# Patient Record
Sex: Female | Born: 1990
Health system: Southern US, Community
[De-identification: ages and names within clinical notes are randomized; demographics above are authoritative.]

---

## 2015-03-05 ENCOUNTER — Encounter (HOSPITAL_BASED_OUTPATIENT_CLINIC_OR_DEPARTMENT_OTHER): Payer: Self-pay | Admitting: *Deleted

## 2015-03-05 ENCOUNTER — Emergency Department (HOSPITAL_BASED_OUTPATIENT_CLINIC_OR_DEPARTMENT_OTHER): Payer: Self-pay

## 2015-03-05 ENCOUNTER — Emergency Department (HOSPITAL_BASED_OUTPATIENT_CLINIC_OR_DEPARTMENT_OTHER)
Admission: EM | Admit: 2015-03-05 | Discharge: 2015-03-05 | Disposition: A | Discharge status: Home / Self Care | Payer: Self-pay | Attending: Emergency Medicine | Admitting: Emergency Medicine

## 2015-03-05 DIAGNOSIS — M545 Low back pain, unspecified: Secondary | ICD-10-CM

## 2015-03-05 DIAGNOSIS — R52 Pain, unspecified: Secondary | ICD-10-CM

## 2015-03-05 DIAGNOSIS — Z3202 Encounter for pregnancy test, result negative: Secondary | ICD-10-CM | POA: Insufficient documentation

## 2015-03-05 LAB — PREGNANCY, URINE: PREG TEST UR: NEGATIVE

## 2015-03-05 LAB — URINALYSIS, ROUTINE W REFLEX MICROSCOPIC
Bilirubin Urine: NEGATIVE
Glucose, UA: NEGATIVE mg/dL
Hgb urine dipstick: NEGATIVE
Ketones, ur: NEGATIVE mg/dL
Leukocytes, UA: NEGATIVE
Nitrite: NEGATIVE
Protein, ur: NEGATIVE mg/dL
SPECIFIC GRAVITY, URINE: 1.023 (ref 1.005–1.030)
Urobilinogen, UA: 1 mg/dL (ref 0.0–1.0)
pH: 7.5 (ref 5.0–8.0)

## 2015-03-05 MED ORDER — CYCLOBENZAPRINE HCL 10 MG PO TABS: 10.0000 mg | ORAL_TABLET | Freq: Two times a day (BID) | ORAL | Status: DC | PRN

## 2015-03-05 MED ORDER — PREDNISONE 20 MG PO TABS: ORAL_TABLET | ORAL | Status: DC

## 2015-03-05 NOTE — ED Provider Notes (Signed)
CSN: [id number]   Arrival date & time 03/05/15 1714  History  This chart was scribed for  Andrea Barrette, MD by Andrea Glover, ED Scribe. This patient was seen in room MH04/MH04 and the patient's care was started at 7:15 PM.  Chief Complaint  Patient presents with  . Back Pain    HPI The history is provided by the patient. No language interpreter was used.   Andrea Glover is a 24 y.o. female who presents to the Emergency Department complaining of chronic lower back pain with onset 1 year ago. The pain is described as spasming and occasionally sharp. Extension of the legs and emotional upset exacerbate the pain. The pain has worsened in the last 3 weeks since starting water aerobics. She took nothing for pain PTA but notes that it is not improved with rest. Rates the pain 5/10 in severity. Pt denies pain radiation, LE weakness or numbness, bowel or bladder incontinence, and abdominal pain.  History reviewed. No pertinent past medical history.  History reviewed. No pertinent past surgical history.  No family history on file.  Social History  Substance Use Topics  . Smoking status: Never Smoker   . Smokeless tobacco: None  . Alcohol Use: No     Review of Systems  Gastrointestinal: Negative for abdominal pain.  Musculoskeletal: Positive for back pain.  Neurological: Negative for weakness and numbness.     Home Medications   Prior to Admission medications   Medication Sig Start Date End Date Taking? Authorizing Provider  cyclobenzaprine (FLEXERIL) 10 MG tablet Take 1 tablet (10 mg total) by mouth 2 (two) times daily as needed for muscle spasms. 03/05/15   Andrea Barrette, MD  predniSONE (DELTASONE) 20 MG tablet 3 tabs po daily x 3 days, then 2 tabs x 3 days, then 1.5 tabs x 3 days, then 1 tab x 3 days, then 0.5 tabs x 3 days 03/05/15   Andrea Barrette, MD    Allergies  Review of patient's allergies indicates no known allergies.  Triage Vitals: BP 102/51 mmHg  Pulse 92  Temp(Src)  98.3 F (36.8 C) (Oral)  Resp 18  Ht 5\' 7"  (1.702 m)  Wt 250 lb (113.399 kg)  BMI 39.15 kg/m2  SpO2 100%  LMP 02/19/2015  Physical Exam  Constitutional: She is oriented to person, place, and time. She appears well-developed and well-nourished.  HENT:  Head: Normocephalic and atraumatic.  Eyes: EOM are normal. Pupils are equal, round, and reactive to light.  Neck: Neck supple.  Cardiovascular: Normal rate, regular rhythm, normal heart sounds and intact distal pulses.   Pulmonary/Chest: Effort normal and breath sounds normal.  Abdominal: Soft. Bowel sounds are normal. She exhibits no distension. There is no tenderness.  Musculoskeletal: Normal range of motion. She exhibits no edema.  Endorses pain over the low lumbar sacral spine and the sacrum.  Neurological: She is alert and oriented to person, place, and time. She has normal strength. She exhibits normal muscle tone. Coordination normal. GCS eye subscore is 4. GCS verbal subscore is 5. GCS motor subscore is 6.  Patient is ambulatory without difficulty.  Skin: Skin is warm, dry and intact.  Psychiatric: She has a normal mood and affect.    ED Course  Procedures   DIAGNOSTIC STUDIES: Oxygen Saturation is 100% on RA, normal by my interpretation.    COORDINATION OF CARE: 7:19 PM Discussed treatment plan which includes lab work and lumbar spine XR with pt at bedside and pt agreed to plan.  8:09 PM  I re-evaluated the patient and provided an update on the results of her XR.  Labs Review-  Labs Reviewed  URINALYSIS, ROUTINE W REFLEX MICROSCOPIC (NOT AT [facility] - Abnormal; Notable for the following:    APPearance CLOUDY (*)    All other components within normal limits  PREGNANCY, URINE    Imaging Review Dg Lumbar Spine Complete  03/05/2015   CLINICAL DATA:  One year of lumbago, increased over past 2 weeks  EXAM: LUMBAR SPINE - COMPLETE 4+ VIEW  COMPARISON:  None.  FINDINGS: Frontal, lateral, spot lumbosacral lateral, and  bilateral oblique views were obtained. There are 5 non-rib-bearing lumbar type vertebral bodies. There is no fracture or spondylolisthesis. Disc spaces appear intact. There is no appreciable facet arthropathy.  IMPRESSION: No fracture or spondylolisthesis.  No appreciable arthropathy.   Electronically Signed   By: Bretta Bang III M.D.   On: 03/05/2015 20:02    EKG Interpretation None     MDM   Final diagnoses:  Midline low back pain without sciatica   Patient presents with chronic lower back pain that has been exacerbated with new activities. No associated neurologic dysfunction, GU dysfunction or GI symptoms. Patient is pursuing new physical activity and weight loss. She is counseled on follow-up with a family physician and sports orthopedics for ongoing assistance with weight loss and exercise in association with low back pain. She'll be treated with prednisone and Flexeril for pain control.   Andrea Barrette, MD 03/05/15 2013

## 2015-03-05 NOTE — Discharge Instructions (Signed)
Back Pain, Adult Low back pain is very common. About 1 in 5 people have back pain.The cause of low back pain is rarely dangerous. The pain often gets better over time.About half of people with a sudden onset of back pain feel better in just 2 weeks. About 8 in 10 people feel better by 6 weeks.  CAUSES Some common causes of back pain include:  Strain of the muscles or ligaments supporting the spine.  Wear and tear (degeneration) of the spinal discs.  Arthritis.  Direct injury to the back. DIAGNOSIS Most of the time, the direct cause of low back pain is not known.However, back pain can be treated effectively even when the exact cause of the pain is unknown.Answering your caregiver's questions about your overall health and symptoms is one of the most accurate ways to make sure the cause of your pain is not dangerous. If your caregiver needs more information, he or she may order lab work or imaging tests (X-rays or MRIs).However, even if imaging tests show changes in your back, this usually does not require surgery. HOME CARE INSTRUCTIONS For many people, back pain returns.Since low back pain is rarely dangerous, it is often a condition that people can learn to manageon their own.   Remain active. It is stressful on the back to sit or stand in one place. Do not sit, drive, or stand in one place for more than 30 minutes at a time. Take short walks on level surfaces as soon as pain allows.Try to increase the length of time you walk each day.  Do not stay in bed.Resting more than 1 or 2 days can delay your recovery.  Do not avoid exercise or work.Your body is made to move.It is not dangerous to be active, even though your back may hurt.Your back will likely heal faster if you return to being active before your pain is gone.  Pay attention to your body when you bend and lift. Many people have less discomfortwhen lifting if they bend their knees, keep the load close to their bodies,and  avoid twisting. Often, the most comfortable positions are those that put less stress on your recovering back.  Find a comfortable position to sleep. Use a firm mattress and lie on your side with your knees slightly bent. If you lie on your back, put a pillow under your knees.  Only take over-the-counter or prescription medicines as directed by your caregiver. Over-the-counter medicines to reduce pain and inflammation are often the most helpful.Your caregiver may prescribe muscle relaxant drugs.These medicines help dull your pain so you can more quickly return to your normal activities and healthy exercise.  Put ice on the injured area.  Put ice in a plastic bag.  Place a towel between your skin and the bag.  Leave the ice on for 15-20 minutes, 03-04 times a day for the first 2 to 3 days. After that, ice and heat may be alternated to reduce pain and spasms.  Ask your caregiver about trying back exercises and gentle massage. This may be of some benefit.  Avoid feeling anxious or stressed.Stress increases muscle tension and can worsen back pain.It is important to recognize when you are anxious or stressed and learn ways to manage it.Exercise is a great option. SEEK MEDICAL CARE IF:  You have pain that is not relieved with rest or medicine.  You have pain that does not improve in 1 week.  You have new symptoms.  You are generally not feeling well. SEEK   IMMEDIATE MEDICAL CARE IF:   You have pain that radiates from your back into your legs.  You develop new bowel or bladder control problems.  You have unusual weakness or numbness in your arms or legs.  You develop nausea or vomiting.  You develop abdominal pain.  You feel faint. Document Released: 07/11/2005 Document Revised: 01/10/2012 Document Reviewed: 11/12/2013 ExitCare Patient Information 2015 [employer]. This information is not intended to replace advice given to you by your health care provider. Make sure you  discuss any questions you have with your health care provider.  

## 2015-03-05 NOTE — ED Notes (Signed)
Lower back pain for a year. States it has gotten to the point she is having back spasms.

## 2015-10-20 ENCOUNTER — Encounter (HOSPITAL_BASED_OUTPATIENT_CLINIC_OR_DEPARTMENT_OTHER): Payer: Self-pay

## 2015-10-20 ENCOUNTER — Emergency Department (HOSPITAL_BASED_OUTPATIENT_CLINIC_OR_DEPARTMENT_OTHER)
Admission: EM | Admit: 2015-10-20 | Discharge: 2015-10-20 | Disposition: A | Discharge status: Home / Self Care | Payer: Self-pay | Attending: Emergency Medicine | Admitting: Emergency Medicine

## 2015-10-20 DIAGNOSIS — R112 Nausea with vomiting, unspecified: Secondary | ICD-10-CM | POA: Insufficient documentation

## 2015-10-20 DIAGNOSIS — R509 Fever, unspecified: Secondary | ICD-10-CM | POA: Insufficient documentation

## 2015-10-20 DIAGNOSIS — Z3202 Encounter for pregnancy test, result negative: Secondary | ICD-10-CM | POA: Insufficient documentation

## 2015-10-20 DIAGNOSIS — R51 Headache: Secondary | ICD-10-CM | POA: Insufficient documentation

## 2015-10-20 DIAGNOSIS — R220 Localized swelling, mass and lump, head: Secondary | ICD-10-CM | POA: Insufficient documentation

## 2015-10-20 LAB — URINALYSIS, ROUTINE W REFLEX MICROSCOPIC
Bilirubin Urine: NEGATIVE
Glucose, UA: NEGATIVE mg/dL
HGB URINE DIPSTICK: NEGATIVE
Ketones, ur: NEGATIVE mg/dL
LEUKOCYTES UA: NEGATIVE
Nitrite: NEGATIVE
Protein, ur: NEGATIVE mg/dL
SPECIFIC GRAVITY, URINE: 1.012 (ref 1.005–1.030)
pH: 6.5 (ref 5.0–8.0)

## 2015-10-20 LAB — PREGNANCY, URINE: Preg Test, Ur: NEGATIVE

## 2015-10-20 MED ORDER — ONDANSETRON 8 MG PO TBDP: 8.0000 mg | ORAL_TABLET | Freq: Three times a day (TID) | ORAL | Status: DC | PRN

## 2015-10-20 MED ORDER — ONDANSETRON HCL 4 MG/2ML IJ SOLN
4.0000 mg | Freq: Once | INTRAMUSCULAR | Status: AC
  Administered 2015-10-20: 4 mg via INTRAVENOUS
  Filled 2015-10-20: qty 2

## 2015-10-20 MED ORDER — IBUPROFEN 400 MG PO TABS
400.0000 mg | ORAL_TABLET | Freq: Once | ORAL | Status: AC
  Administered 2015-10-20: 400 mg via ORAL
  Filled 2015-10-20: qty 1

## 2015-10-20 MED ORDER — SODIUM CHLORIDE 0.9 % IV BOLUS (SEPSIS)
1000.0000 mL | Freq: Once | INTRAVENOUS | Status: AC
  Administered 2015-10-20: 1000 mL via INTRAVENOUS

## 2015-10-20 NOTE — ED Notes (Signed)
Pt c/o an episode of n/v morning at 0400 as well as facial swelling.  No medicine taken at home to relieve symptoms.  Pt c/o bilateral eye pain and generalized abdominal pain.

## 2015-10-20 NOTE — Discharge Instructions (Signed)
Nausea, Adult °Nausea is the feeling that you have an upset stomach or have to vomit. Nausea by itself is not likely a serious concern, but it may be an early sign of more serious medical problems. As nausea gets worse, it can lead to vomiting. If vomiting develops, there is the risk of dehydration.  °CAUSES  °· Viral infections. °· Food poisoning. °· Medicines. °· Pregnancy. °· Motion sickness. °· Migraine headaches. °· Emotional distress. °· Severe pain from any source. °· Alcohol intoxication. °HOME CARE INSTRUCTIONS °· Get plenty of rest. °· Ask your caregiver about specific rehydration instructions. °· Eat small amounts of food and sip liquids more often. °· Take all medicines as told by your caregiver. °SEEK MEDICAL CARE IF: °· You have not improved after 2 days, or you get worse. °· You have a headache. °SEEK IMMEDIATE MEDICAL CARE IF:  °· You have a fever. °· You faint. °· You keep vomiting or have blood in your vomit. °· You are extremely weak or dehydrated. °· You have dark or bloody stools. °· You have severe chest or abdominal pain. °MAKE SURE YOU: °· Understand these instructions. °· Will watch your condition. °· Will get help right away if you are not doing well or get worse. °  °This information is not intended to replace advice given to you by your health care provider. Make sure you discuss any questions you have with your health care provider. °  °Document Released: 08/18/2004 Document Revised: 08/01/2014 Document Reviewed: 03/23/2011 °Elsevier Interactive Patient Education ©2016 Elsevier Inc. ° °

## 2015-10-20 NOTE — ED Provider Notes (Signed)
CSN: [id number]     Arrival date & time 10/20/15  0600 History   First MD Initiated Contact with Patient 10/20/15 [phone]     Chief Complaint  Patient presents with  . Facial Swelling     (Consider location/radiation/quality/duration/timing/severity/associated sxs/prior Treatment) HPI  This is a 25 year old female who awakened this morning it 4 AM with facial swelling, nausea and vomiting. She estimates she vomited about 8 times. The swelling in her face has subsequently improved significantly. The swelling was not painful but she has a headache. She had a subjective fever but is afebrile on arrival. She had some generalized abdominal pain earlier that is no longer present. She denies diarrhea.   History reviewed. No pertinent past medical history. History reviewed. No pertinent past surgical history. No family history on file. Social History  Substance Use Topics  . Smoking status: Never Smoker   . Smokeless tobacco: None  . Alcohol Use: No   OB History    No data available     Review of Systems  All other systems reviewed and are negative.   Allergies  Review of patient's allergies indicates no known allergies.  Home Medications   Prior to Admission medications   Medication Sig Start Date End Date Taking? Authorizing Provider  cyclobenzaprine (FLEXERIL) 10 MG tablet Take 1 tablet (10 mg total) by mouth 2 (two) times daily as needed for muscle spasms. 03/05/15   Arby BarretteMarcy Pfeiffer, MD  predniSONE (DELTASONE) 20 MG tablet 3 tabs po daily x 3 days, then 2 tabs x 3 days, then 1.5 tabs x 3 days, then 1 tab x 3 days, then 0.5 tabs x 3 days 03/05/15   Arby BarretteMarcy Pfeiffer, MD   BP 150/81 mmHg  Pulse 69  Temp(Src) 98.3 F (36.8 C) (Oral)  Resp 18  Ht 5\' 7"  (1.702 m)  Wt 270 lb (122.471 kg)  BMI 42.28 kg/m2  SpO2 100%  LMP 10/02/2015   Physical Exam General: Well-developed, well-nourished female in no acute distress; appearance consistent with age of record HENT: normocephalic;  atraumatic; salivary glands nontender and without significant enlargement Eyes: pupils equal, round and reactive to light; extraocular muscles intact Neck: supple Heart: regular rate and rhythm Lungs: clear to auscultation bilaterally Abdomen: soft; nondistended; nontender; no masses or hepatosplenomegaly; bowel sounds present Extremities: No deformity; full range of motion; pulses normal Neurologic: Awake, alert and oriented; motor function intact in all extremities and symmetric; no facial droop Skin: Warm and dry Psychiatric: Normal mood and affect    ED Course  Procedures (including critical care time)   MDM  Nursing notes and vitals signs, including pulse oximetry, reviewed.  Summary of this visit's results, reviewed by myself:  Labs:  Results for orders placed or performed during the hospital encounter of 10/20/15 (from the past 24 hour(s))  Pregnancy, urine     Status: None   Collection Time: 10/20/15  6:45 AM  Result Value Ref Range   Preg Test, Ur NEGATIVE NEGATIVE  Urinalysis, Routine w reflex microscopic ([facility])     Status: Abnormal   Collection Time: 10/20/15  6:45 AM  Result Value Ref Range   Color, Urine YELLOW YELLOW   APPearance CLOUDY (A) CLEAR   Specific Gravity, Urine 1.012 1.005 - 1.030   pH 6.5 5.0 - 8.0   Glucose, UA NEGATIVE NEGATIVE mg/dL   Hgb urine dipstick NEGATIVE NEGATIVE   Bilirubin Urine NEGATIVE NEGATIVE   Ketones, ur NEGATIVE NEGATIVE mg/dL   Protein, ur NEGATIVE NEGATIVE mg/dL  Nitrite NEGATIVE NEGATIVE   Leukocytes, UA NEGATIVE NEGATIVE   7:20 AM IVF bolus and Zofran ordered.   Paula Libra, MD 10/20/15 [phone]

## 2015-10-20 NOTE — ED Notes (Signed)
Woke w n/v this am  States jaws were swollen,  Eye pain  Denies diarrhea

## 2015-12-06 ENCOUNTER — Emergency Department (HOSPITAL_BASED_OUTPATIENT_CLINIC_OR_DEPARTMENT_OTHER)
Admission: EM | Admit: 2015-12-06 | Discharge: 2015-12-06 | Disposition: A | Discharge status: Home / Self Care | Payer: Self-pay | Attending: Emergency Medicine | Admitting: Emergency Medicine

## 2015-12-06 ENCOUNTER — Encounter (HOSPITAL_BASED_OUTPATIENT_CLINIC_OR_DEPARTMENT_OTHER): Payer: Self-pay | Admitting: Emergency Medicine

## 2015-12-06 DIAGNOSIS — L02212 Cutaneous abscess of back [any part, except buttock]: Secondary | ICD-10-CM | POA: Insufficient documentation

## 2015-12-06 DIAGNOSIS — L0291 Cutaneous abscess, unspecified: Secondary | ICD-10-CM

## 2015-12-06 MED ORDER — SULFAMETHOXAZOLE-TRIMETHOPRIM 800-160 MG PO TABS: 1.0000 | ORAL_TABLET | Freq: Two times a day (BID) | ORAL | Status: AC

## 2015-12-06 MED ORDER — LIDOCAINE HCL 2 % IJ SOLN
5.0000 mL | Freq: Once | INTRAMUSCULAR | Status: AC
  Administered 2015-12-06: 100 mg
  Filled 2015-12-06: qty 20

## 2015-12-06 NOTE — ED Notes (Signed)
Gauze dressing applied to wound on back.  Covered with tegaderm and taped at edges.

## 2015-12-06 NOTE — ED Notes (Signed)
Patient reprots that she has a "knot" under her left arm and to her lower back.

## 2015-12-06 NOTE — Discharge Instructions (Signed)
Please take all of your antibiotics until finished!  Follow up with your doctor or an urgent care in order to remove your packing in 48-72 hours.  You may return to the emergency department if you have  a fever that persists greater than 101 or your abscess appears to become infected (growing surrounding redness and warmth). Abscess An abscess (boil or furuncle) is an infected area that contains a collection of pus.  SYMPTOMS Signs and symptoms of an abscess include pain, tenderness, redness, or hardness. You may feel a moveable soft area under your skin. An abscess can occur anywhere in the body.  TREATMENT  A surgical cut (incision) may be made over your abscess to drain the pus. Gauze may be packed into the space or a drain may be looped through the abscess cavity (pocket). This provides a drain that will allow the cavity to heal from the inside outwards. The abscess may be painful for a few days, but should feel much better if it was drained.  Your abscess, if seen early, may not have localized and may not have been drained. If not, another appointment may be required if it does not get better on its own or with medications. HOME CARE INSTRUCTIONS   Only take over-the-counter or prescription medicines for pain, discomfort, or fever as directed by your caregiver.   Take your antibiotics as directed if they were prescribed. Finish them even if you start to feel better.   Keep the skin and clothes clean around your abscess.   If the abscess was drained, you will need to use gauze dressing to collect any draining pus. Dressings will typically need to be changed 3 or more times a day.   The infection may spread by skin contact with others. Avoid skin contact as much as possible.   Practice good hygiene. This includes regular hand washing, cover any draining skin lesions, and do not share personal care items.   If you participate in sports, do not share athletic equipment, towels, whirlpools,  or personal care items. Shower after every practice or tournament.   If a draining area cannot be adequately covered:   Do not participate in sports.   Children should not participate in day care until the wound has healed or drainage stops.   If your caregiver has given you a follow-up appointment, it is very important to keep that appointment. Not keeping the appointment could result in a much worse infection, chronic or permanent injury, pain, and disability. If there is any problem keeping the appointment, you must call back to this facility for assistance.  SEEK MEDICAL CARE IF:   You develop increased pain, swelling, redness, drainage, or bleeding in the wound site.   You develop signs of generalized infection including muscle aches, chills, fever, or a general ill feeling.   You have an oral temperature above 102 F (38.9 C).  MAKE SURE YOU:   Understand these instructions.   Will watch your condition.   Will get help right away if you are not doing well or get worse.  Document Released: 04/20/2005 Document Revised: 03/23/2011 Document Reviewed: 02/12/2008 [facility] Patient Information 2012 [place], [place].

## 2015-12-07 NOTE — ED Provider Notes (Signed)
CSN: [id number]     Arrival date & time 12/06/15  2145 History   First MD Initiated Contact with Patient 12/06/15 2212     Chief Complaint  Patient presents with  . Recurrent Skin Infections    (Consider location/radiation/quality/duration/timing/severity/associated sxs/prior Treatment) The history is provided by the patient and medical records. No language interpreter was used.   Andrea Glover is a 25 y.o. female  with no pertinent PMH who presents to the Emergency Department complaining of a painful (8/10) knot on her lower back. Patient states she first noticed a pea sized knot approximately 2 months ago. Over this timeframe, knot continued to grow. 2 days ago pain intensified and area became very tender to the touch. Tylenol taken with no relief of pain. Denies fever. No IV drug use. Has had similar areas occur in the past. She states one had to be drained, but others improved with medications.  History reviewed. No pertinent past medical history. History reviewed. No pertinent past surgical history. History reviewed. No pertinent family history. Social History  Substance Use Topics  . Smoking status: Never Smoker   . Smokeless tobacco: None  . Alcohol Use: No   OB History    No data available     Review of Systems  Constitutional: Negative for fever.  HENT: Negative for congestion.   Eyes: Negative for visual disturbance.  Respiratory: Negative for cough and shortness of breath.   Cardiovascular: Negative for chest pain.  Gastrointestinal: Negative for abdominal pain.  Musculoskeletal: Negative for arthralgias.  Skin: Positive for color change.  Allergic/Immunologic: Negative for immunocompromised state.  Neurological: Negative for headaches.      Allergies  Review of patient's allergies indicates no known allergies.  Home Medications   Prior to Admission medications   Medication Sig Start Date End Date Taking? Authorizing Provider  ondansetron (ZOFRAN ODT) 8 MG  disintegrating tablet Take 1 tablet (8 mg total) by mouth every 8 (eight) hours as needed for nausea or vomiting. 10/20/15   Paula Libra, MD  sulfamethoxazole-trimethoprim (BACTRIM DS,SEPTRA DS) 800-160 MG tablet Take 1 tablet by mouth 2 (two) times daily. 12/06/15 12/13/15  Fitzhugh Vizcarrondo Pilcher Mavi Un, PA-C   BP 119/61 mmHg  Pulse 82  Temp(Src) 98 F (36.7 C) (Oral)  Resp 16  Ht  (1.727 m)  Wt 117.028 kg  BMI 39.24 kg/m2  SpO2 100%  LMP 11/23/2015 Physical Exam  Constitutional: She is oriented to person, place, and time. She appears well-developed and well-nourished.  Alert and in no acute distress  HENT:  Head: Normocephalic and atraumatic.  Cardiovascular: Normal rate, regular rhythm and normal heart sounds.   Pulmonary/Chest: Effort normal and breath sounds normal. No respiratory distress. She has no wheezes. She has no rales.  Abdominal: Soft. She exhibits no distension. There is no tenderness.  Musculoskeletal: Normal range of motion.  No midline spinal tenderness.   Neurological: She is alert and oriented to person, place, and time.  Skin: Skin is warm and dry.     Nursing note and vitals reviewed.   ED Course  Procedures (including critical care time)  INCISION AND DRAINAGE Performed by: Chase Picket Esdras Delair Consent: Verbal consent obtained. Risks and benefits: risks, benefits and alternatives were discussed Type: abscess Body area: back Anesthesia: local infiltration Incision was made with a scalpel. Local anesthetic: lidocaine 2% Anesthetic total: 5 ml Complexity: complex Blunt dissection to break up loculations Drainage: purulent Drainage amount: moderate Packing material: 1/4 in iodoform gauze Patient tolerance: Patient tolerated the procedure  well with no immediate complications.   Labs Review Labs Reviewed - No data to display  Imaging Review No results found. I have personally reviewed and evaluated these images and lab results as part of my medical  decision-making.   EKG Interpretation None      MDM   Final diagnoses:  Abscess   Patient presenting with abscess requiring incision and drainage. Afebrile. No crepitance to suggest necrotizing fasciitis.  Incision and drainage performed per procedure note. Patient tolerated the procedure well. Patient was prescribed bactrim. Wound care instructions provided. Follow up for wound check in 2-3 days. Information for finding PCP given with discharge instructions. Return to ER if concern for spread of infection, increasing pain, fevers, or other concerns.    [facility] CenterJaime Pilcher Twilla Khouri, PA-C 12/07/15 0055  Melene Planan Floyd, DO 12/07/15 1921

## 2016-02-07 ENCOUNTER — Emergency Department (HOSPITAL_BASED_OUTPATIENT_CLINIC_OR_DEPARTMENT_OTHER): Payer: Self-pay

## 2016-02-07 ENCOUNTER — Encounter (HOSPITAL_BASED_OUTPATIENT_CLINIC_OR_DEPARTMENT_OTHER): Payer: Self-pay | Admitting: *Deleted

## 2016-02-07 ENCOUNTER — Emergency Department (HOSPITAL_BASED_OUTPATIENT_CLINIC_OR_DEPARTMENT_OTHER)
Admission: EM | Admit: 2016-02-07 | Discharge: 2016-02-08 | Disposition: A | Discharge status: Home / Self Care | Payer: Self-pay | Attending: Emergency Medicine | Admitting: Emergency Medicine

## 2016-02-07 DIAGNOSIS — R109 Unspecified abdominal pain: Secondary | ICD-10-CM

## 2016-02-07 DIAGNOSIS — R61 Generalized hyperhidrosis: Secondary | ICD-10-CM | POA: Insufficient documentation

## 2016-02-07 DIAGNOSIS — R509 Fever, unspecified: Secondary | ICD-10-CM | POA: Insufficient documentation

## 2016-02-07 DIAGNOSIS — K2970 Gastritis, unspecified, without bleeding: Secondary | ICD-10-CM | POA: Insufficient documentation

## 2016-02-07 LAB — COMPREHENSIVE METABOLIC PANEL
ALT: 12 U/L — ABNORMAL LOW (ref 14–54)
AST: 21 U/L (ref 15–41)
Albumin: 3.2 g/dL — ABNORMAL LOW (ref 3.5–5.0)
Alkaline Phosphatase: 66 U/L (ref 38–126)
Anion gap: 7 (ref 5–15)
BUN: 8 mg/dL (ref 6–20)
CO2: 24 mmol/L (ref 22–32)
Calcium: 8.5 mg/dL — ABNORMAL LOW (ref 8.9–10.3)
Chloride: 103 mmol/L (ref 101–111)
Creatinine, Ser: 0.73 mg/dL (ref 0.44–1.00)
GFR calc Af Amer: >60 mL/min (ref >60)
GFR calc non Af Amer: >60 mL/min (ref >60)
Glucose, Bld: 94 mg/dL (ref 65–99)
Potassium: 3.7 mmol/L (ref 3.5–5.1)
Sodium: 134 mmol/L — ABNORMAL LOW (ref 135–145)
Total Bilirubin: 0.4 mg/dL (ref 0.3–1.2)
Total Protein: 7.5 g/dL (ref 6.5–8.1)

## 2016-02-07 LAB — URINALYSIS, ROUTINE W REFLEX MICROSCOPIC
Glucose, UA: NEGATIVE mg/dL
Hgb urine dipstick: NEGATIVE
Ketones, ur: 15 mg/dL — AB
Leukocytes, UA: NEGATIVE
Nitrite: NEGATIVE
Protein, ur: NEGATIVE mg/dL
Specific Gravity, Urine: 1.026 (ref 1.005–1.030)
pH: 6 (ref 5.0–8.0)

## 2016-02-07 LAB — CBC
HCT: 35.2 % — ABNORMAL LOW (ref 36.0–46.0)
Hemoglobin: 11.5 g/dL — ABNORMAL LOW (ref 12.0–15.0)
MCH: 24.3 pg — ABNORMAL LOW (ref 26.0–34.0)
MCHC: 32.7 g/dL (ref 30.0–36.0)
MCV: 74.3 fL — ABNORMAL LOW (ref 78.0–100.0)
Platelets: 198 10*3/uL (ref 150–400)
RBC: 4.74 MIL/uL (ref 3.87–5.11)
RDW: 17.4 % — ABNORMAL HIGH (ref 11.5–15.5)
WBC: 4.6 10*3/uL (ref 4.0–10.5)

## 2016-02-07 LAB — PREGNANCY, URINE: Preg Test, Ur: NEGATIVE

## 2016-02-07 LAB — LIPASE, BLOOD: Lipase: 17 U/L (ref 11–51)

## 2016-02-07 MED ORDER — HYDROMORPHONE HCL 1 MG/ML IJ SOLN
1.0000 mg | Freq: Once | INTRAMUSCULAR | Status: AC
  Administered 2016-02-07: 1 mg via INTRAVENOUS
  Filled 2016-02-07: qty 1

## 2016-02-07 MED ORDER — ONDANSETRON HCL 4 MG PO TABS: 4.0000 mg | ORAL_TABLET | Freq: Four times a day (QID) | ORAL | Status: DC

## 2016-02-07 MED ORDER — ONDANSETRON HCL 4 MG/2ML IJ SOLN
4.0000 mg | Freq: Once | INTRAMUSCULAR | Status: AC
  Administered 2016-02-07: 4 mg via INTRAVENOUS
  Filled 2016-02-07: qty 2

## 2016-02-07 MED ORDER — FAMOTIDINE IN NACL 20-0.9 MG/50ML-% IV SOLN
20.0000 mg | Freq: Once | INTRAVENOUS | Status: AC
  Administered 2016-02-07: 20 mg via INTRAVENOUS
  Filled 2016-02-07: qty 50

## 2016-02-07 MED ORDER — IOPAMIDOL (ISOVUE-300) INJECTION 61%
100.0000 mL | Freq: Once | INTRAVENOUS | Status: AC | PRN
  Administered 2016-02-07: 100 mL via INTRAVENOUS

## 2016-02-07 MED ORDER — SODIUM CHLORIDE 0.9 % IV BOLUS (SEPSIS)
1000.0000 mL | Freq: Once | INTRAVENOUS | Status: AC
  Administered 2016-02-07: 1000 mL via INTRAVENOUS

## 2016-02-07 NOTE — Discharge Instructions (Signed)
Abdominal Pain, Adult °Many things can cause abdominal pain. Usually, abdominal pain is not caused by a disease and will improve without treatment. It can often be observed and treated at home. Your health care provider will do a physical exam and possibly order blood tests and X-rays to help determine the seriousness of your pain. However, in many cases, more time must pass before a clear cause of the pain can be found. Before that point, your health care provider may not know if you need more testing or further treatment. °HOME CARE INSTRUCTIONS °Monitor your abdominal pain for any changes. The following actions may help to alleviate any discomfort you are experiencing: °· Only take over-the-counter or prescription medicines as directed by your health care provider. °· Do not take laxatives unless directed to do so by your health care provider. °· Try a clear liquid diet (broth, tea, or water) as directed by your health care provider. Slowly move to a bland diet as tolerated. °SEEK MEDICAL CARE IF: °· You have unexplained abdominal pain. °· You have abdominal pain associated with nausea or diarrhea. °· You have pain when you urinate or have a bowel movement. °· You experience abdominal pain that wakes you in the night. °· You have abdominal pain that is worsened or improved by eating food. °· You have abdominal pain that is worsened with eating fatty foods. °· You have a fever. °SEEK IMMEDIATE MEDICAL CARE IF: °· Your pain does not go away within 2 hours. °· You keep throwing up (vomiting). °· Your pain is felt only in portions of the abdomen, such as the right side or the left lower portion of the abdomen. °· You pass bloody or black tarry stools. °MAKE SURE YOU: °· Understand these instructions. °· Will watch your condition. °· Will get help right away if you are not doing well or get worse. °  °This information is not intended to replace advice given to you by your health care provider. Make sure you discuss  any questions you have with your health care provider. °  °Document Released: 04/20/2005 Document Revised: 04/01/2015 Document Reviewed: 03/20/2013 °Elsevier Interactive Patient Education ©2016 [employer] ° °Gastritis, Adult °Gastritis is soreness and puffiness (inflammation) of the lining of the stomach. If you do not get help, gastritis can cause bleeding and sores (ulcers) in the stomach. °HOME CARE  °· Only take medicine as told by your doctor. °· If you were given antibiotic medicines, take them as told. Finish the medicines even if you start to feel better. °· Drink enough fluids to keep your pee (urine) clear or pale yellow. °· Avoid foods and drinks that make your problems worse. Foods you may want to avoid include: °¨ Caffeine or alcohol. °¨ Chocolate. °¨ Mint. °¨ Garlic and onions. °¨ Spicy foods. °¨ Citrus fruits, including oranges, lemons, or limes. °¨ Food containing tomatoes, including sauce, chili, salsa, and pizza. °¨ Fried and fatty foods. °· Eat small meals throughout the day instead of large meals. °GET HELP RIGHT AWAY IF:  °· You have black or dark red poop (stools). °· You throw up (vomit) blood. It may look like coffee grounds. °· You cannot keep fluids down. °· Your belly (abdominal) pain gets worse. °· You have a fever. °· You do not feel better after 1 week. °· You have any other questions or concerns. °MAKE SURE YOU:  °· Understand these instructions. °· Will watch your condition. °· Will get help right away if you are not doing   well or get worse. °  °This information is not intended to replace advice given to you by your health care provider. Make sure you discuss any questions you have with your health care provider. °  °Document Released: 12/28/2007 Document Revised: 10/03/2011 Document Reviewed: 08/24/2011 °Elsevier Interactive Patient Education ©2016 [employer] ° °

## 2016-02-07 NOTE — ED Provider Notes (Signed)
CSN: [id number]     Arrival date & time 02/07/16  1928 History  By signing my name below, I, Andrea Glover, attest that this documentation has been prepared under the direction and in the presence of Raeford Razor, MD . Electronically Signed: Levon Glover, Scribe. 02/07/2016. 9:07 PM.  Chief Complaint  Patient presents with  . Emesis   The history is provided by the patient. No language interpreter was used.   HPI Comments:  Andrea Glover is a 25 y.o. female who presents to the Emergency Department complaining of intermittent vomiting onset last night. Pt has been unable to keep food or fluids down. She has taken Dramamine with no relief. Pt reports Dramamine "gave her heartburn". She notes nausea yesterday, periumbilical pain, fever, chills, and diaphoresis.  Pt has positive sick contact. No abdominal PSHx. No hx of  ulcer or reflux. She denies dysuria, hematuria, and diarrhea.  History reviewed. No pertinent past medical history. History reviewed. No pertinent past surgical history. History reviewed. No pertinent family history. Social History  Substance Use Topics  . Smoking status: Never Smoker   . Smokeless tobacco: None  . Alcohol Use: No   OB History    No data available     Review of Systems  Constitutional: Positive for fever, chills and diaphoresis.  Gastrointestinal: Positive for nausea, vomiting and abdominal pain. Negative for diarrhea.  Genitourinary: Negative for dysuria and hematuria.  All other systems reviewed and are negative.   Allergies  Review of patient's allergies indicates no known allergies.  Home Medications   Prior to Admission medications   Not on File   BP 115/74 mmHg  Pulse 91  Temp(Src) 99.6 F (37.6 C) (Oral)  Resp 20  Ht  (1.702 m)  Wt 230 lb (104.327 kg)  BMI 36.01 kg/m2  SpO2 99%  LMP 02/05/2016 Physical Exam  Constitutional: She is oriented to person, place, and time. She appears well-developed and well-nourished. No  distress.  HENT:  Head: Normocephalic and atraumatic.  Eyes: Conjunctivae are normal.  Cardiovascular: Normal rate.   Pulmonary/Chest: Effort normal.  Abdominal: She exhibits no distension. There is tenderness. There is no rebound and no guarding.  Periumbilical and epigastric tenderness  Neurological: She is alert and oriented to person, place, and time.  Skin: Skin is warm and dry.  Psychiatric: She has a normal mood and affect.  Nursing note and vitals reviewed.   ED Course  Procedures  DIAGNOSTIC STUDIES:  Oxygen Saturation is 98% on RA, normal by my interpretation.    COORDINATION OF CARE:  9:01 PM Will order CT scan. Discussed treatment plan with pt at bedside and pt agreed to plan.  Labs Review Labs Reviewed  URINALYSIS, ROUTINE W REFLEX MICROSCOPIC (NOT AT [facility]) - Abnormal; Notable for the following:    APPearance CLOUDY (*)    Bilirubin Urine SMALL (*)    Ketones, ur 15 (*)    All other components within normal limits  COMPREHENSIVE METABOLIC PANEL - Abnormal; Notable for the following:    Sodium 134 (*)    Calcium 8.5 (*)    Albumin 3.2 (*)    ALT 12 (*)    All other components within normal limits  CBC - Abnormal; Notable for the following:    Hemoglobin 11.5 (*)    HCT 35.2 (*)    MCV 74.3 (*)    MCH 24.3 (*)    RDW 17.4 (*)    All other components within normal limits  PREGNANCY, URINE  LIPASE, BLOOD    Imaging Review No results found. I have personally reviewed and evaluated these images and lab results as part of my medical decision-making.   EKG Interpretation None      MDM   Final diagnoses:  Abdominal pain, unspecified abdominal location  Gastritis    I personally preformed the services scribed in my presence. The recorded information has been reviewed is accurate. Raeford RazorStephen Aniruddh Ciavarella, MD.   Raeford RazorStephen Mordechai Matuszak, MD 02/25/16 [phone]

## 2016-02-07 NOTE — ED Notes (Signed)
N/V x 1 day.  Reports abdominal and back pain when vomiting.

## 2016-03-09 ENCOUNTER — Emergency Department (HOSPITAL_BASED_OUTPATIENT_CLINIC_OR_DEPARTMENT_OTHER)
Admission: EM | Admit: 2016-03-09 | Discharge: 2016-03-10 | Disposition: A | Discharge status: Home / Self Care | Payer: Self-pay | Attending: Emergency Medicine | Admitting: Emergency Medicine

## 2016-03-09 ENCOUNTER — Encounter (HOSPITAL_BASED_OUTPATIENT_CLINIC_OR_DEPARTMENT_OTHER): Payer: Self-pay

## 2016-03-09 DIAGNOSIS — J039 Acute tonsillitis, unspecified: Secondary | ICD-10-CM | POA: Insufficient documentation

## 2016-03-09 NOTE — ED Triage Notes (Signed)
C/o right earache and sore throat x  5days after being ina pool-NAD-steady gait

## 2016-03-10 LAB — RAPID STREP SCREEN (MED CTR MEBANE ONLY): STREPTOCOCCUS, GROUP A SCREEN (DIRECT): NEGATIVE

## 2016-03-10 MED ORDER — DEXAMETHASONE 6 MG PO TABS
12.0000 mg | ORAL_TABLET | Freq: Once | ORAL | Status: AC
Start: 2016-03-10 — End: 2016-03-10
  Administered 2016-03-10: 12 mg via ORAL
  Filled 2016-03-10: qty 2

## 2016-03-10 MED ORDER — OXYCODONE-ACETAMINOPHEN 5-325 MG PO TABS
1.0000 | ORAL_TABLET | Freq: Once | ORAL | Status: AC
  Administered 2016-03-10: 1 via ORAL
  Filled 2016-03-10: qty 1

## 2016-03-10 MED ORDER — OXYCODONE-ACETAMINOPHEN 5-325 MG PO TABS
1.0000 | ORAL_TABLET | ORAL | 0 refills | Status: DC | PRN
Start: 2016-03-10 — End: 2017-05-03

## 2016-03-10 NOTE — ED Provider Notes (Signed)
[facility]   CSN: [id number] Arrival date & time: 03/09/16  2210     History   Chief Complaint Chief Complaint  Patient presents with  . Otalgia    HPI Andrea Glover is a 25 y.o. female.  The history is provided by the patient.  She comes in complaining of pain in her throat and right ear for the last 4 days. Pain is severe and she rates it at 8/10. It is worse with swallowing. Nothing makes it better. She is not treated it with anything. She denies fever, chills, sweats. She denies rhinorrhea. There is no cough, vomiting, diarrhea. She denies arthralgias or myalgias. Sick contacts.  History reviewed. No pertinent past medical history.  There are no active problems to display for this patient.   History reviewed. No pertinent surgical history.  OB History    No data available       Home Medications    Prior to Admission medications   Not on File    Family History No family history on file.  Social History Social History  Substance Use Topics  . Smoking status: Never Smoker  . Smokeless tobacco: Never Used  . Alcohol use No     Allergies   Review of patient's allergies indicates no known allergies.   Review of Systems Review of Systems  All other systems reviewed and are negative.    Physical Exam Updated Vital Signs BP 112/86 (BP Location: Left Arm)   Pulse 87   Temp 98 F (36.7 C) (Oral)   Resp 18   Ht 5\' 7"  (1.702 m)   Wt 258 lb (117 kg)   LMP 03/03/2016   SpO2 100%   BMI 40.41 kg/m   Physical Exam  Nursing note and vitals reviewed.  25 year old female, resting comfortably and in no acute distress. Vital signs are normal. Oxygen saturation is 100%, which is normal. Head is normocephalic and atraumatic. PERRLA, EOMI. tympanic membranes are clear bilaterally. Oropharynx shows some moderate to severe tonsillar hypertrophy bilaterally with exudate present. Neck is nontender and supple without adenopathy or  JVD. Back is nontender and there is no CVA tenderness. Lungs are clear without rales, wheezes, or rhonchi. Chest is nontender. Heart has regular rate and rhythm without murmur. Abdomen is soft, flat, nontender without masses or hepatosplenomegaly and peristalsis is normoactive. Extremities have no cyanosis or edema, full range of motion is present. Skin is warm and dry without rash. Neurologic: Mental status is normal, cranial nerves are intact, there are no motor or sensory deficits.  ED Treatments / Results  Labs (all labs ordered are listed, but only abnormal results are displayed) Labs Reviewed  RAPID STREP SCREEN (NOT AT [facility])  CULTURE, GROUP A STREP [facility])    Procedures Procedures (including critical care time)  Medications Ordered in ED Medications  dexamethasone (DECADRON) tablet 12 mg (12 mg Oral Given 03/10/16 0024)  oxyCODONE-acetaminophen (PERCOCET/ROXICET) 5-325 MG per tablet 1 tablet (1 tablet Oral Given 03/10/16 0024)     Initial Impression / Assessment and Plan / ED Course  I have reviewed the triage vital signs and the nursing notes.  Pertinent labs that were available during my care of the patient were reviewed by me and considered in my medical decision making (see chart for details).  Clinical Course    Tonsillitis. Ear pain is clearly referred from her tonsils. Strep screen is obtained and she's given a dose of dexamethasone as well as oxycodone-acetaminophen.  Strep screen  is come back negative. Specimen is sent for culture. She is discharged with prescription for oxycodone-acetaminophen. Work release given for 2 days.  Final Clinical Impressions(s) / ED Diagnoses   Final diagnoses:  Tonsillitis with exudate    New Prescriptions New Prescriptions   OXYCODONE-ACETAMINOPHEN (PERCOCET) 5-325 MG TABLET    Take 1 tablet by mouth every 4 (four) hours as needed for moderate pain.     Dione Boozeavid Cannan Beeck, MD 03/10/16 [phone]

## 2016-03-12 LAB — CULTURE, GROUP A STREP (THRC)

## 2016-12-02 ENCOUNTER — Emergency Department (HOSPITAL_BASED_OUTPATIENT_CLINIC_OR_DEPARTMENT_OTHER): Payer: Self-pay

## 2016-12-02 ENCOUNTER — Emergency Department (HOSPITAL_BASED_OUTPATIENT_CLINIC_OR_DEPARTMENT_OTHER)
Admission: EM | Admit: 2016-12-02 | Discharge: 2016-12-02 | Disposition: A | Discharge status: Home / Self Care | Payer: Self-pay | Attending: Emergency Medicine | Admitting: Emergency Medicine

## 2016-12-02 ENCOUNTER — Encounter (HOSPITAL_BASED_OUTPATIENT_CLINIC_OR_DEPARTMENT_OTHER): Payer: Self-pay

## 2016-12-02 DIAGNOSIS — R0981 Nasal congestion: Secondary | ICD-10-CM

## 2016-12-02 DIAGNOSIS — R05 Cough: Secondary | ICD-10-CM | POA: Insufficient documentation

## 2016-12-02 DIAGNOSIS — R0982 Postnasal drip: Secondary | ICD-10-CM | POA: Insufficient documentation

## 2016-12-02 DIAGNOSIS — R059 Cough, unspecified: Secondary | ICD-10-CM

## 2016-12-02 DIAGNOSIS — R509 Fever, unspecified: Secondary | ICD-10-CM | POA: Insufficient documentation

## 2016-12-02 MED ORDER — BENZONATATE 100 MG PO CAPS: 100.0000 mg | ORAL_CAPSULE | Freq: Three times a day (TID) | ORAL | 0 refills | Status: DC | PRN

## 2016-12-02 MED ORDER — BENZONATATE 100 MG PO CAPS
200.0000 mg | ORAL_CAPSULE | Freq: Once | ORAL | Status: AC
  Administered 2016-12-02: 200 mg via ORAL
  Filled 2016-12-02: qty 2

## 2016-12-02 MED FILL — BENZONATATE 100 MG CAP: 100 | 30 days supply | Qty: 90 | Fill #0

## 2016-12-02 NOTE — ED Triage Notes (Signed)
URI symptoms x 1 month.

## 2016-12-02 NOTE — ED Provider Notes (Signed)
[facility] Provider Note   CSN: [id number] Arrival date & time: 12/02/16  1538   By signing my name below, I, Soijett Blue, attest that this documentation has been prepared under the direction and in the presence of Michela PitcherMina Valma Rotenberg, PA-C Electronically Signed: Soijett Blue, ED Scribe. 12/02/16. 4:41 PM.  History   Chief Complaint Chief Complaint  Patient presents with  . URI    HPI Andrea Glover is a 26 y.o. female who presents to the Emergency Department complaining of constant, productive cough x yellow sputum onset 1 month ago. She notes that her cough is worse at night only. Pt reports associated voice hoarseness, subjective fever at night only, post-nasal drip, and rhinorrhea. Pt has tried OTC tylenol sinus and cold, benadryl, mucinex, zyrtec, claritin with no relief of her symptoms. She denies sinus pressure, sinus pain, sore throat, abdominal pain, nausea, vomiting, CP, SOB, fatigue, and any other symptoms. She notes that she is UTD with her immunizations. Denies PMHx of asthma, but notes that she has had bronchitis twice in the past.    The history is provided by the patient. No language interpreter was used.    History reviewed. No pertinent past medical history.  There are no active problems to display for this patient.   History reviewed. No pertinent surgical history.  OB History    No data available       Home Medications    Prior to Admission medications   Medication Sig Start Date End Date Taking? Authorizing Provider  benzonatate (TESSALON) 100 MG capsule Take 1 capsule (100 mg total) by mouth 3 (three) times daily as needed for cough. 12/02/16   Lenus Trauger A, PA-C  oxyCODONE-acetaminophen (PERCOCET) 5-325 MG tablet Take 1 tablet by mouth every 4 (four) hours as needed for moderate pain. 03/10/16   Dione BoozeGlick, David, MD    Family History No family history on file.  Social History Social History  Substance Use Topics  . Smoking status: Never Smoker  .  Smokeless tobacco: Never Used  . Alcohol use No     Allergies   Patient has no known allergies.   Review of Systems Review of Systems  Constitutional: Positive for fever (subjective). Negative for fatigue.  HENT: Positive for postnasal drip and rhinorrhea. Negative for sinus pain, sinus pressure and sore throat.        +voice hoarseness  Respiratory: Positive for cough (productive, yellow sputum). Negative for shortness of breath.   Cardiovascular: Negative for chest pain.  Gastrointestinal: Negative for abdominal pain, nausea and vomiting.     Physical Exam Updated Vital Signs BP 115/79 (BP Location: Left Arm)   Pulse 100   Temp 98 F (36.7 C) (Oral)   Resp 18   Ht 5\' 9"  (1.753 m)   LMP 11/02/2016   SpO2 98%   Physical Exam  Constitutional: She is oriented to person, place, and time. She appears well-developed and well-nourished. No distress.  HENT:  Head: Normocephalic and atraumatic.  Right Ear: Tympanic membrane, external ear and ear canal normal. Tympanic membrane is not erythematous and not bulging.  Left Ear: Tympanic membrane, external ear and ear canal normal. Tympanic membrane is not erythematous and not bulging.  Nose: No septal deviation. Right sinus exhibits no maxillary sinus tenderness and no frontal sinus tenderness. Left sinus exhibits no maxillary sinus tenderness and no frontal sinus tenderness.  Mouth/Throat: Uvula is midline, oropharynx is clear and moist and mucous membranes are normal.  Oropharynx clear with post nasal drip. TM's  with serous fluid, no bulging or erythema. Nasal septum is midline with pale pink boggy mucosa.   Eyes: EOM are normal.  Neck: Neck supple.  Cardiovascular: Normal rate, regular rhythm, normal heart sounds and intact distal pulses.  Exam reveals no gallop and no friction rub.   No murmur heard. Pulmonary/Chest: Effort normal and breath sounds normal. No respiratory distress. She has no wheezes. She has no rales.  Abdominal:  She exhibits no distension.  Musculoskeletal: Normal range of motion.  Lymphadenopathy:    She has no cervical adenopathy.  Neurological: She is alert and oriented to person, place, and time.  Skin: Skin is warm and dry.  Psychiatric: She has a normal mood and affect. Her behavior is normal.  Nursing note and vitals reviewed.    ED Treatments / Results  DIAGNOSTIC STUDIES: Oxygen Saturation is 98% on RA, nl by my interpretation.    COORDINATION OF CARE: 4:37 PM Discussed treatment plan with pt at bedside which includes CXR, flonase, antihistamine, tessalon Rx, and pt agreed to plan.   Radiology Dg Chest 2 View  Result Date: 12/02/2016 CLINICAL DATA:  Productive cough EXAM: CHEST  2 VIEW COMPARISON:  May 11, 2016 FINDINGS: Lungs are clear. Heart size and pulmonary vascularity are normal. No pneumothorax. No adenopathy. No bone lesions. IMPRESSION: No edema or consolidation. Electronically Signed   By: Bretta Bang III M.D.   On: 12/02/2016 16:20    Procedures Procedures (including critical care time)  Medications Ordered in ED Medications  benzonatate (TESSALON) capsule 200 mg (200 mg Oral Given 12/02/16 1647)     Initial Impression / Assessment and Plan / ED Course  I have reviewed the triage vital signs and the nursing notes.  Pertinent imaging results that were available during my care of the patient were reviewed by me and considered in my medical decision making (see chart for details).     Patient with productive cough and nasal congestion for 1 month. Afebrile, vital signs are stable, SpO2 98% on RA. Up-to-date on immunizations. Lungs clear to auscultation bilaterally. Remainder physical examination consistent with allergies. Chest x-ray negative for consolidation or infiltrate. Low suspicion of pneumonia, PE, tuberculosis, bronchitis, pleural effusion. Will Rx Tessalon for cough, encourage patient to try different over-the-counter allergy medications just  Flonase for congestion and to reduce postnasal drip and Allegra. Recommend follow-up with primary care for reevaluation. Discussed strict ED return precautions. Pt verbalized understanding of and agreement with plan and is safe for discharge home at this time.   Final Clinical Impressions(s) / ED Diagnoses   Final diagnoses:  Cough  Nasal congestion    New Prescriptions Discharge Medication List as of 12/02/2016  4:41 PM    START taking these medications   Details  benzonatate (TESSALON) 100 MG capsule Take 1 capsule (100 mg total) by mouth 3 (three) times daily as needed for cough., Starting Fri 12/02/2016, Print       I personally performed the services described in this documentation, which was scribed in my presence. The recorded information has been reviewed and is accurate.    Jeanie Sewer, PA-C 12/03/16 2243    Nira Conn, MD 12/06/16 1500

## 2016-12-02 NOTE — Discharge Instructions (Signed)
Take Tessalon as needed for cough. Try Flonase over-the-counter for your nasal congestion. Try other allergy medications such as Allegra for your cough and congestion. [facility] for [facility]. Return to the ED if you develop any concerning symptoms.

## 2017-05-03 ENCOUNTER — Emergency Department (HOSPITAL_BASED_OUTPATIENT_CLINIC_OR_DEPARTMENT_OTHER)
Admission: EM | Admit: 2017-05-03 | Discharge: 2017-05-03 | Disposition: A | Discharge status: Home / Self Care | Payer: Self-pay | Attending: Emergency Medicine | Admitting: Emergency Medicine

## 2017-05-03 ENCOUNTER — Encounter (HOSPITAL_BASED_OUTPATIENT_CLINIC_OR_DEPARTMENT_OTHER): Payer: Self-pay

## 2017-05-03 DIAGNOSIS — J0100 Acute maxillary sinusitis, unspecified: Secondary | ICD-10-CM | POA: Insufficient documentation

## 2017-05-03 MED ORDER — OXYMETAZOLINE HCL 0.05 % NA SOLN: 2.0000 | Freq: Two times a day (BID) | NASAL | 0 refills | Status: AC

## 2017-05-03 MED ORDER — IBUPROFEN 600 MG PO TABS: 600.0000 mg | ORAL_TABLET | Freq: Four times a day (QID) | ORAL | 0 refills | Status: DC | PRN

## 2017-05-03 MED ORDER — FLUTICASONE PROPIONATE 50 MCG/ACT NA SUSP: 2.0000 | Freq: Every day | NASAL | 0 refills | Status: AC

## 2017-05-03 MED ORDER — LORATADINE 10 MG PO TABS: 10.0000 mg | ORAL_TABLET | Freq: Every day | ORAL | 0 refills | Status: AC

## 2017-05-03 MED FILL — LORATADINE 10 MG TABLET: 10 | 100 days supply | Qty: 100 | Fill #0

## 2017-05-03 MED FILL — NASAL DECONGESTANT 0.05% SP: 0.05 | 15 days supply | Qty: 15 | Fill #0

## 2017-05-03 MED FILL — IBUPROFEN 600 MG TABLET: 600 | 7 days supply | Qty: 30 | Fill #0

## 2017-05-03 MED FILL — FLUTICASONE PROP 50 MCG SPR: 50 | 30 days supply | Qty: 16 | Fill #0

## 2017-05-03 NOTE — ED Triage Notes (Signed)
C/o HA x 4 days-c/o redness around left eye x 2 days and states she can feel her heart beat in her left ear-NAD-steady gait-texting in triage

## 2017-05-03 NOTE — ED Provider Notes (Signed)
[facility] Provider Note   CSN: [id number] Arrival date & time: 05/03/17  1143     History   Chief Complaint Chief Complaint  Patient presents with  . Headache    HPI Andrea Glover is a 26 y.o. female.  HPI Patient with 4 days of gradual onset left-sided headache. States the headache is worse with bending forward. She has left ear fullness and states she can hear her heartbeat in her ear. Denies any fever or chills. No neck pain or stiffness. No focal weakness or numbness. Patient states she's had migraine headache in the past with some similar symptoms such as photophobia and nausea. She is taking no medications at home. History reviewed. No pertinent past medical history.  There are no active problems to display for this patient.   History reviewed. No pertinent surgical history.  OB History    No data available       Home Medications    Prior to Admission medications   Medication Sig Start Date End Date Taking? Authorizing Provider  fluticasone (FLONASE) 50 MCG/ACT nasal spray Place 2 sprays into both nostrils daily. 05/03/17   Loren Racer, MD  ibuprofen (ADVIL,MOTRIN) 600 MG tablet Take 1 tablet (600 mg total) by mouth every 6 (six) hours as needed for moderate pain. 05/03/17   Loren Racer, MD  loratadine (CLARITIN) 10 MG tablet Take 1 tablet (10 mg total) by mouth daily. 05/03/17   Loren Racer, MD  oxymetazoline (AFRIN NASAL SPRAY) 0.05 % nasal spray Place 2 sprays into both nostrils 2 (two) times daily. 05/03/17 05/06/17  Loren Racer, MD    Family History No family history on file.  Social History Social History  Substance Use Topics  . Smoking status: Never Smoker  . Smokeless tobacco: Never Used  . Alcohol use No     Allergies   Patient has no known allergies.   Review of Systems Review of Systems  Constitutional: Negative for chills and fever.  HENT: Positive for sinus pain and sinus pressure. Negative for  congestion, rhinorrhea and sore throat.   Eyes: Positive for photophobia. Negative for visual disturbance.  Respiratory: Negative for cough and shortness of breath.   Cardiovascular: Negative for chest pain.  Gastrointestinal: Positive for nausea and vomiting. Negative for abdominal pain.  Genitourinary: Negative for dysuria, flank pain and frequency.  Musculoskeletal: Negative for back pain, myalgias and neck pain.  Skin: Negative for rash and wound.  Neurological: Positive for headaches. Negative for dizziness, syncope, weakness and numbness.  All other systems reviewed and are negative.    Physical Exam Updated Vital Signs BP 105/84 (BP Location: Left Arm)   Pulse 76   Temp 98.3 F (36.8 C) (Oral)   Resp 18   Ht  (1.727 m)   Wt (!) 145.2 kg (320 lb)   LMP 04/18/2017   SpO2 100%   BMI 48.66 kg/m   Physical Exam  Constitutional: She is oriented to person, place, and time. She appears well-developed and well-nourished. No distress.  HENT:  Head: Normocephalic and atraumatic.  Mouth/Throat: Oropharynx is clear and moist.  Left greater than right nasal mucosal edema. Patient has mild left frontal sinus tenderness to percussion. Significant left maxillary sinus tenderness to percussion. Oropharynx is clear. Mild bulging of the left TM.  Eyes: Pupils are equal, round, and reactive to light. EOM are normal.  Neck: Normal range of motion. Neck supple.  No meningismus  Cardiovascular: Normal rate and regular rhythm.  Exam reveals no gallop  and no friction rub.   No murmur heard. Pulmonary/Chest: Effort normal and breath sounds normal.  Abdominal: Soft. Bowel sounds are normal. There is no tenderness. There is no rebound and no guarding.  Musculoskeletal: Normal range of motion. She exhibits no edema or tenderness.  Lymphadenopathy:    She has no cervical adenopathy.  Neurological: She is alert and oriented to person, place, and time.  Patient is alert and oriented x3 with  clear, goal oriented speech. Patient has 5/5 motor in all extremities. Sensation is intact to light touch. Patient has a normal gait and walks without assistance.  Skin: Skin is warm and dry. Capillary refill takes less than 2 seconds. No rash noted. She is not diaphoretic. No erythema.  Psychiatric: She has a normal mood and affect. Her behavior is normal.  Nursing note and vitals reviewed.    ED Treatments / Results  Labs (all labs ordered are listed, but only abnormal results are displayed) Labs Reviewed - No data to display  EKG  EKG Interpretation None       Radiology No results found.  Procedures Procedures (including critical care time)  Medications Ordered in ED Medications - No data to display   Initial Impression / Assessment and Plan / ED Course  I have reviewed the triage vital signs and the nursing notes.  Pertinent labs & imaging results that were available during my care of the patient were reviewed by me and considered in my medical decision making (see chart for details).     Patient with left sided sinus disease likely triggering her migrainous symptoms. Normal neurologic exam. No red flag signs or symptoms. Return precautions given.  Final Clinical Impressions(s) / ED Diagnoses   Final diagnoses:  Acute maxillary sinusitis, recurrence not specified    New Prescriptions New Prescriptions   FLUTICASONE (FLONASE) 50 MCG/ACT NASAL SPRAY    Place 2 sprays into both nostrils daily.   IBUPROFEN (ADVIL,MOTRIN) 600 MG TABLET    Take 1 tablet (600 mg total) by mouth every 6 (six) hours as needed for moderate pain.   LORATADINE (CLARITIN) 10 MG TABLET    Take 1 tablet (10 mg total) by mouth daily.   OXYMETAZOLINE (AFRIN NASAL SPRAY) 0.05 % NASAL SPRAY    Place 2 sprays into both nostrils 2 (two) times daily.     Loren Racer, MD 05/03/17 [phone]

## 2017-05-21 ENCOUNTER — Emergency Department (HOSPITAL_BASED_OUTPATIENT_CLINIC_OR_DEPARTMENT_OTHER)
Admission: EM | Admit: 2017-05-21 | Discharge: 2017-05-21 | Disposition: A | Discharge status: Home / Self Care | Payer: Self-pay | Attending: Emergency Medicine | Admitting: Emergency Medicine

## 2017-05-21 ENCOUNTER — Encounter (HOSPITAL_BASED_OUTPATIENT_CLINIC_OR_DEPARTMENT_OTHER): Payer: Self-pay | Admitting: Emergency Medicine

## 2017-05-21 DIAGNOSIS — J020 Streptococcal pharyngitis: Secondary | ICD-10-CM | POA: Insufficient documentation

## 2017-05-21 DIAGNOSIS — H9202 Otalgia, left ear: Secondary | ICD-10-CM | POA: Insufficient documentation

## 2017-05-21 LAB — RAPID STREP SCREEN (MED CTR MEBANE ONLY): STREPTOCOCCUS, GROUP A SCREEN (DIRECT): POSITIVE — AB

## 2017-05-21 MED ORDER — PENICILLIN G BENZATHINE 1200000 UNIT/2ML IM SUSP
1.2000 10*6.[IU] | Freq: Once | INTRAMUSCULAR | Status: AC
  Administered 2017-05-21: 1.2 10*6.[IU] via INTRAMUSCULAR
  Filled 2017-05-21: qty 2

## 2017-05-21 MED ORDER — ONDANSETRON 4 MG PO TBDP
4.0000 mg | ORAL_TABLET | Freq: Once | ORAL | Status: AC | PRN
  Administered 2017-05-21: 4 mg via ORAL
  Filled 2017-05-21: qty 1

## 2017-05-21 MED ORDER — LORATADINE 10 MG PO TABS: 10.0000 mg | ORAL_TABLET | Freq: Every day | ORAL | 0 refills | Status: AC

## 2017-05-21 NOTE — ED Notes (Signed)
Pt given d/c instructions as per chart. Rx x 1. Verbalizes understanding. No questions. 

## 2017-05-21 NOTE — ED Triage Notes (Signed)
Sore throat x 2 days with L ear pain.

## 2017-05-21 NOTE — ED Provider Notes (Signed)
[facility] EMERGENCY DEPARTMENT Provider Note   CSN: [id number] Arrival date & time: 05/21/17  1723     History   Chief Complaint Chief Complaint  Patient presents with  . Sore Throat    HPI Andrea Glover is a 26 y.o. female.  HPI   Andrea Glover is a 26 year old female with no significant past medical history who presents the emergency department for evaluation of sore throat and left ear pain.  She states that her sore throat started 3 days ago.  Reports 8/10 constant pain which is "like sandpaper scratching together."  Pain is worsened with swallowing.  She has tried ibuprofen, salt water gargles, warm liquids with some relief.  She also reports left ear pain which is 8/10 in severity and described as "popping" in nature.  Ear pain started at the same time as the sore throat.  States that she has had left ear pain in the past, was told that she had fluid behind the ears and was given allergy medicine.  She denies tinnitus, ear drainage, fever, trismus, cough, congestion, shortness of breath, chest pain.  She is able to drink liquids without trouble.   When told that she has a positive strep test patient asks for antibiotic shot instead of p.o. antibiotics as she has a hard time swallowing pills.   History reviewed. No pertinent past medical history.  There are no active problems to display for this patient.   History reviewed. No pertinent surgical history.  OB History    No data available       Home Medications    Prior to Admission medications   Medication Sig Start Date End Date Taking? Authorizing Provider  fluticasone (FLONASE) 50 MCG/ACT nasal spray Place 2 sprays into both nostrils daily. 05/03/17   Loren RacerYelverton, David, MD  ibuprofen (ADVIL,MOTRIN) 600 MG tablet Take 1 tablet (600 mg total) by mouth every 6 (six) hours as needed for moderate pain. 05/03/17   Loren RacerYelverton, David, MD  loratadine (CLARITIN) 10 MG tablet Take 1 tablet (10 mg total) by mouth  daily. 05/03/17   Loren RacerYelverton, David, MD  loratadine (CLARITIN) 10 MG tablet Take 1 tablet (10 mg total) by mouth daily. 05/21/17   Kellie ShropshireShrosbree, Jozalyn Baglio J, PA-C    Family History No family history on file.  Social History Social History  Substance Use Topics  . Smoking status: Never Smoker  . Smokeless tobacco: Never Used  . Alcohol use No     Allergies   Patient has no known allergies.   Review of Systems Review of Systems  Constitutional: Negative for chills, fatigue and fever.  HENT: Positive for ear pain and sore throat. Negative for congestion, ear discharge, facial swelling, hearing loss, sinus pain, sinus pressure, trouble swallowing and voice change.   Eyes: Negative for visual disturbance.  Respiratory: Negative for shortness of breath.   Cardiovascular: Negative for chest pain.     Physical Exam Updated Vital Signs BP 131/84 (BP Location: Right Arm)   Pulse 88   Temp 100 F (37.8 C) (Oral)   Resp 18   Ht 5\' 8"  (1.727 m)   Wt (!) 145.2 kg (320 lb)   LMP 05/08/2017   SpO2 100%   BMI 48.66 kg/m   Physical Exam  Constitutional: She appears well-developed and well-nourished. No distress.  HENT:  Head: Normocephalic and atraumatic.  Nose: Nose normal.  Moist mucous membranes.  Oropharynx erythematous with enlarged tonsils.  No tonsillar exudate.  Uvula midline. Patient able to handle oral  secretions.  No tenderness over mastoids or tragus.  External ear canals normal.  Bilateral TMs with good cone of light.  Eyes: Pupils are equal, round, and reactive to light. Conjunctivae are normal. Right eye exhibits no discharge. Left eye exhibits no discharge.  Neck: Normal range of motion. Neck supple. No tracheal deviation present.  Tender cervical adenopathy in the anterior chain.  Cardiovascular: Normal rate and regular rhythm.  Exam reveals no friction rub.   No murmur heard. Pulmonary/Chest: Effort normal and breath sounds normal. No respiratory distress. She has no  wheezes. She has no rales.  Neurological: She is alert. Coordination normal.  Skin: She is not diaphoretic.  Psychiatric: She has a normal mood and affect. Her behavior is normal.  Nursing note and vitals reviewed.    ED Treatments / Results  Labs (all labs ordered are listed, but only abnormal results are displayed) Labs Reviewed  RAPID STREP SCREEN (NOT AT [facility]) - Abnormal; Notable for the following:       Result Value   Streptococcus, Group A Screen (Direct) POSITIVE (*)    All other components within normal limits    EKG  EKG Interpretation None       Radiology No results found.  Procedures Procedures (including critical care time)  Medications Ordered in ED Medications  ondansetron (ZOFRAN-ODT) disintegrating tablet 4 mg (4 mg Oral Given 05/21/17 2107)  penicillin g benzathine (BICILLIN LA) 1200000 UNIT/2ML injection 1.2 Million Units (1.2 Million Units Intramuscular Given 05/21/17 2225)     Initial Impression / Assessment and Plan / ED Course  I have reviewed the triage vital signs and the nursing notes.  Pertinent labs & imaging results that were available during my care of the patient were reviewed by me and considered in my medical decision making (see chart for details).    Pt with sore throat, strep test is positive. Treated in the ED with IM penicillin.  She also complains of left ear pain and "popping," no signs of infection on exam.  Suspect fluid in the middle ear related to allergies. Have discussed treatment with Claritin.  Pt appears well hydrated, discussed importance of water rehydration. Presentation non concerning for PTA or RPA. No trismus or uvula deviation. Specific return precautions discussed. Pt able to drink water in ED without difficulty with intact air way.  Patient does not have a PCP, I have given her information to establish care and follow-up with [facility].  Patient agrees and voiced understanding at the bedside.  Final Clinical  Impressions(s) / ED Diagnoses   Final diagnoses:  Strep throat    New Prescriptions New Prescriptions   LORATADINE (CLARITIN) 10 MG TABLET    Take 1 tablet (10 mg total) by mouth daily.     Kellie Shropshire, PA-C 05/22/17 Margarito Courser    Gwyneth Sprout, MD 05/22/17 [phone]

## 2017-05-21 NOTE — Discharge Instructions (Signed)
Your strep test was positive in the ER today. You were given antibiotic shot in the ER which will help you clear the infection.   Please continue to stay hydrated and push fluids. You can use ibuprofen for pain. He can also try over-the-counter throat lozenges, salt water gargles, honey for pain relief.   Your ear did not look infected. Please take Claritin daily to help with allergies which may be causing your ears to pop.   I have listed the information to [facility] below.  [facility] and accept patients who do not have insurance.  Please schedule appointment to establish care and for follow-up of further health concerns.  Please return to the ER if you have shortness of breath due to throat swelling, or have any new or worsening symptoms.

## 2018-04-07 IMAGING — CT CT ABD-PELV W/ CM
2 of 4 series · 16 of 46 positions shown, 18 images · IV contrast (APPLIED)
Comparison: None.

CLINICAL DATA: Initial evaluation for acute abdominal pain, nausea,
vomiting.

EXAM:
CT ABDOMEN AND PELVIS WITH CONTRAST
TECHNIQUE: Multidetector CT imaging of the abdomen and pelvis was performed
using the standard protocol following bolus administration of
intravenous contrast.
CONTRAST:  100mL A6PUTO-C33 IOPAMIDOL (A6PUTO-C33) INJECTION 61%

[Series 2: axial st · axial · 0.75mm/px · z∈[+456,+896]mm · 13 of 96 slices shown, 15 images]
[im 4/96  soft-tissue]
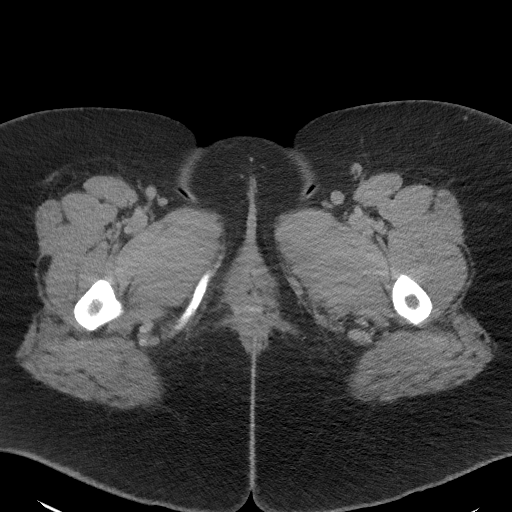
[im 4/96  bone]
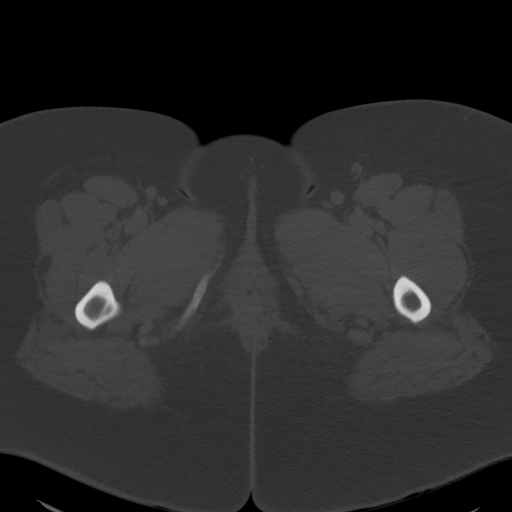
[im 12/96  soft-tissue]
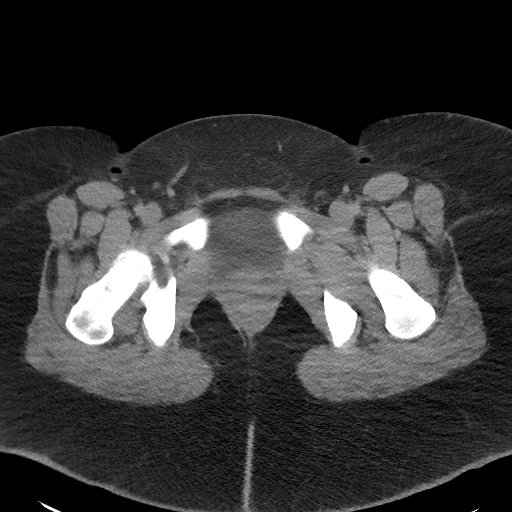
[im 20/96  soft-tissue]
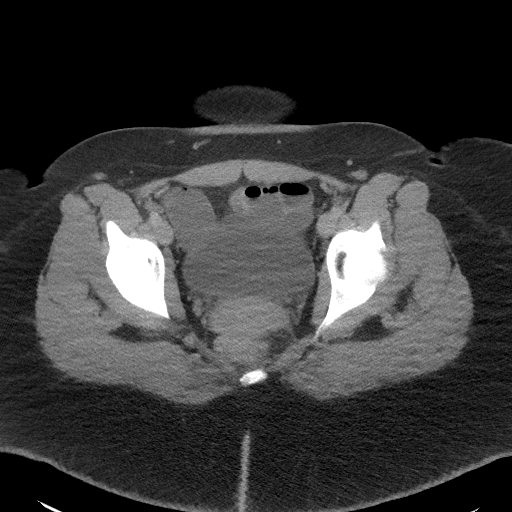
[im 28/96  soft-tissue]
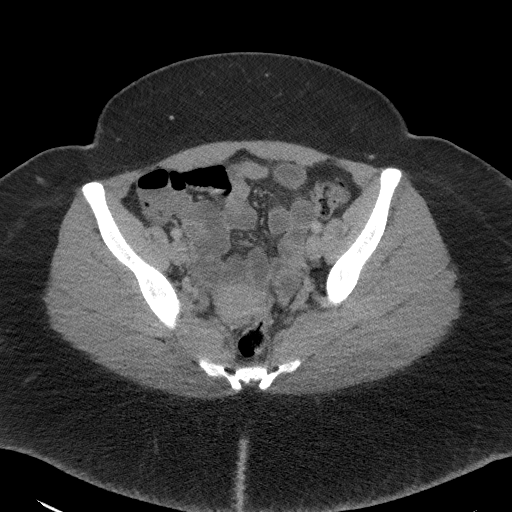
[im 32/96  soft-tissue]
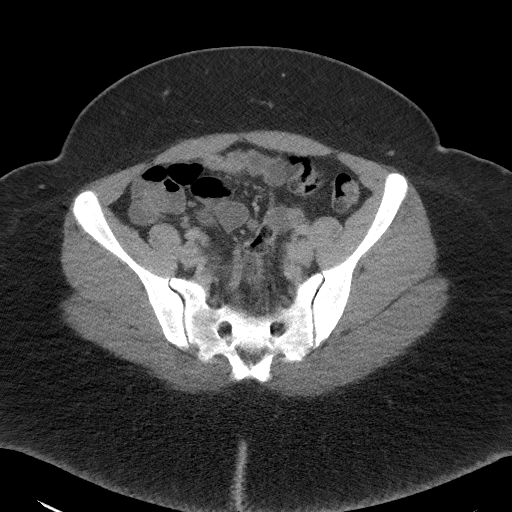
[im 40/96  soft-tissue]
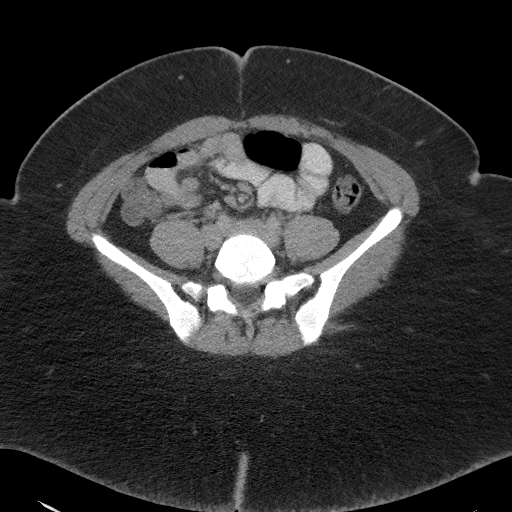
[im 48/96  soft-tissue]
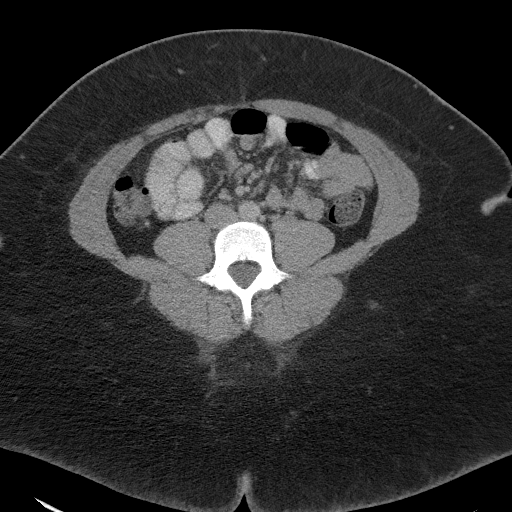
[im 56/96  soft-tissue]
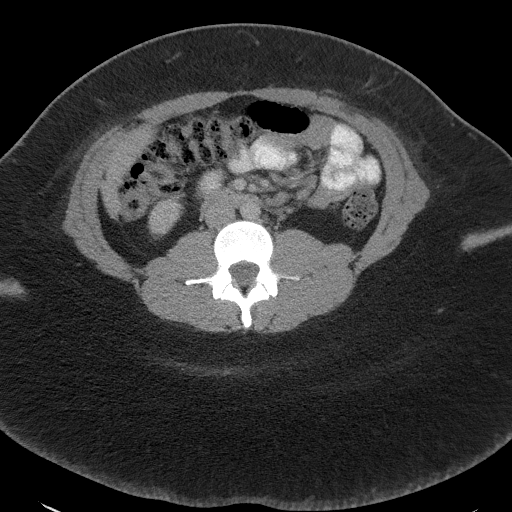
[im 64/96  soft-tissue]
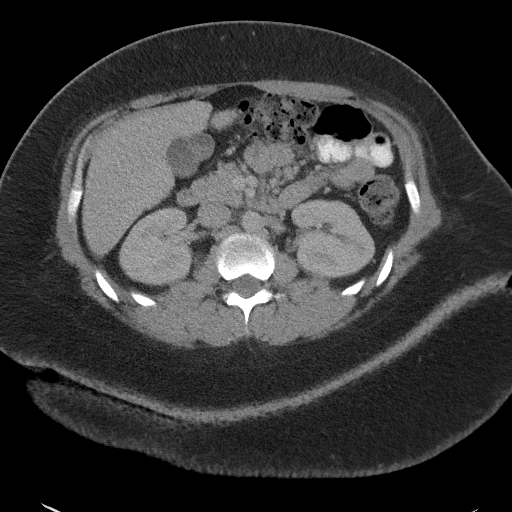
[im 64/96  bone]
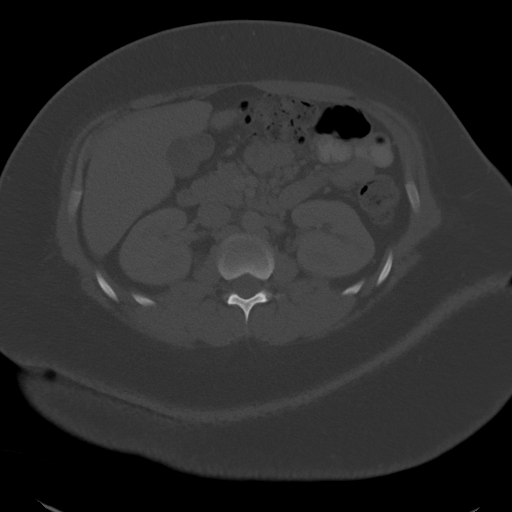
[im 68/96  soft-tissue]
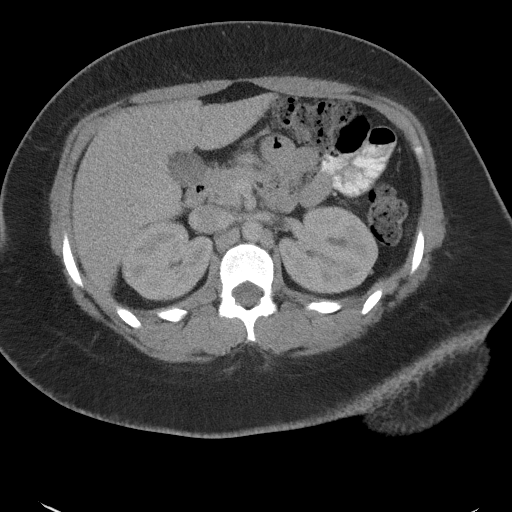
[im 76/96  soft-tissue]
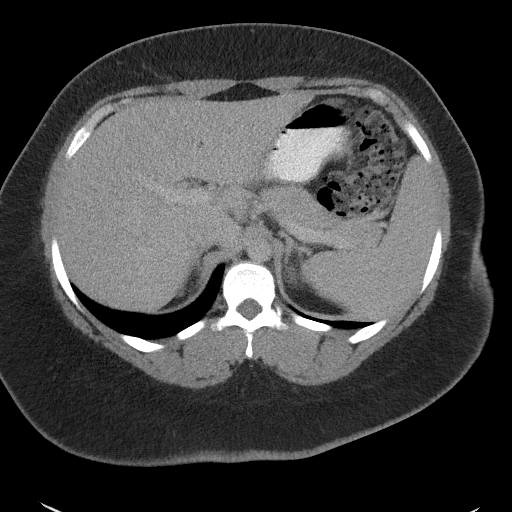
[im 84/96  soft-tissue]
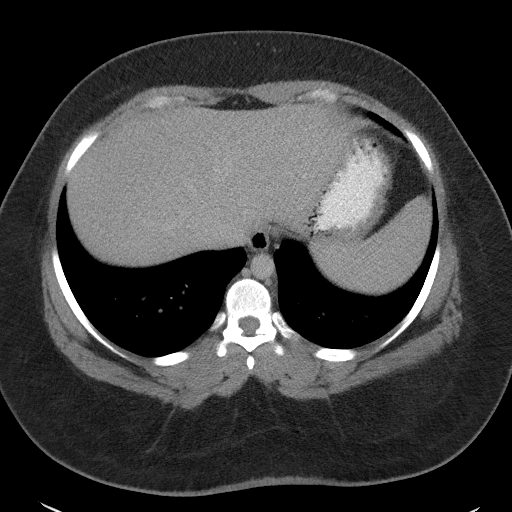
[im 92/96  soft-tissue]
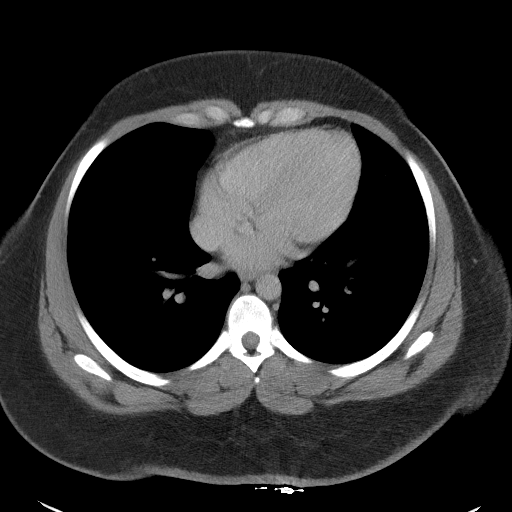

[Series 5: coronal st · coronal · 0.77mm/px · 3 of 101 slices shown]
[im 34/101  soft-tissue]
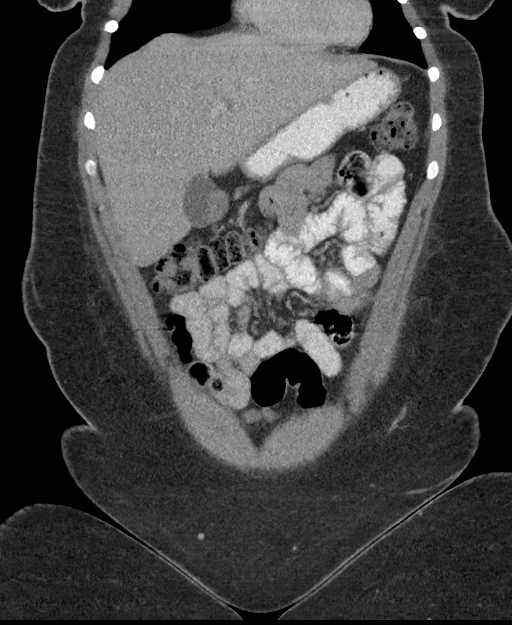
[im 45/101  soft-tissue]
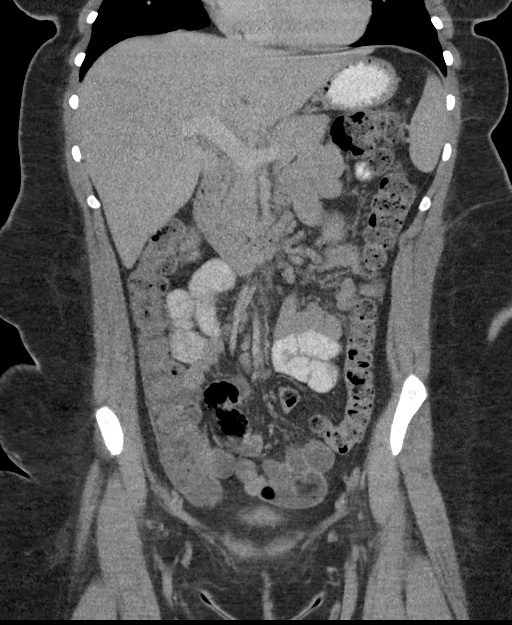
[im 56/101  soft-tissue]
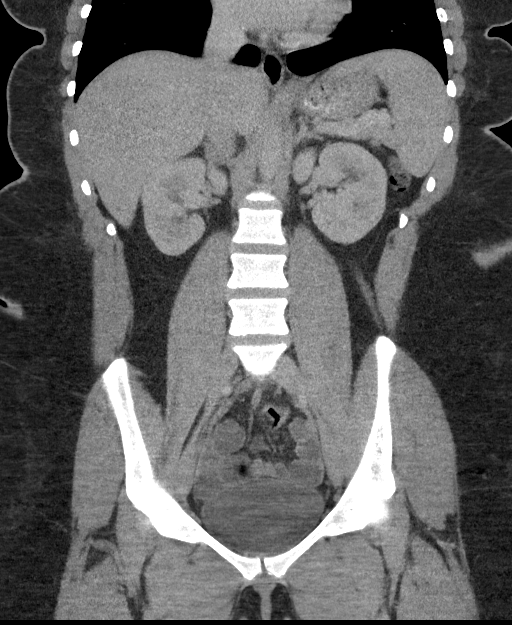

[16 of 46 positions shown; findings below may reference images not displayed]

FINDINGS: Visualized lung bases are clear.

Liver demonstrates a normal contrast enhanced appearance. Layering
hyperdensity within the gallbladder fundus may reflect a small
amount of sludge. No biliary dilatation. Spleen, adrenal glands, and
pancreas demonstrate a normal contrast enhanced appearance.

Kidneys are equal in size with symmetric enhancement. No
nephrolithiasis, hydronephrosis, or focal enhancing renal mass.

Stomach within normal limits. No evidence for bowel obstruction.
Appendix visualized in the lower mid abdomen and is of normal
caliber and appearance without associated inflammatory changes to
suggest acute appendicitis. No abnormal wall thickening, mucosal
enhancement, or inflammatory fat stranding seen elsewhere about the
bowels. Moderate amount of retained stool within the colon.

Bladder within normal limits. Uterus and ovaries normal for patient
age.

No free air or fluid. No pathologically enlarged intra-abdominal or
pelvic lymph nodes. Normal intravascular enhancement seen throughout
the intra-abdominal aorta and its branch vessels.

No acute osseous abnormality. No worrisome lytic or blastic osseous
lesions.
IMPRESSION: 1. No CT evidence for acute intra-abdominal pelvic process.
2. Layering hyperdensity within the gallbladder fundus, which may
reflect gallbladder sludge. No CT findings to suggest acute
cholecystitis.

## 2018-05-10 ENCOUNTER — Encounter (HOSPITAL_BASED_OUTPATIENT_CLINIC_OR_DEPARTMENT_OTHER): Payer: Self-pay | Admitting: Emergency Medicine

## 2018-05-10 ENCOUNTER — Other Ambulatory Visit: Payer: Self-pay

## 2018-05-10 ENCOUNTER — Emergency Department (HOSPITAL_BASED_OUTPATIENT_CLINIC_OR_DEPARTMENT_OTHER)
Admission: EM | Admit: 2018-05-10 | Discharge: 2018-05-10 | Disposition: A | Discharge status: Home / Self Care | Payer: Self-pay | Attending: Emergency Medicine | Admitting: Emergency Medicine

## 2018-05-10 DIAGNOSIS — R509 Fever, unspecified: Secondary | ICD-10-CM | POA: Insufficient documentation

## 2018-05-10 DIAGNOSIS — R05 Cough: Secondary | ICD-10-CM | POA: Insufficient documentation

## 2018-05-10 DIAGNOSIS — Z79899 Other long term (current) drug therapy: Secondary | ICD-10-CM | POA: Insufficient documentation

## 2018-05-10 DIAGNOSIS — J029 Acute pharyngitis, unspecified: Secondary | ICD-10-CM

## 2018-05-10 LAB — GROUP A STREP BY PCR: GROUP A STREP BY PCR: NOT DETECTED

## 2018-05-10 MED ORDER — IBUPROFEN 800 MG PO TABS
800.0000 mg | ORAL_TABLET | Freq: Once | ORAL | Status: AC
Start: 2018-05-10 — End: 2018-05-10
  Administered 2018-05-10: 800 mg via ORAL
  Filled 2018-05-10: qty 1

## 2018-05-10 NOTE — ED Triage Notes (Signed)
Pt reports sore throat with fever x 2 days.

## 2018-05-10 NOTE — Discharge Instructions (Addendum)
You may alternate Tylenol 1000 mg every 6 hours as needed for pain and Ibuprofen 800 mg every 8 hours as needed for pain.  Please take Ibuprofen with food.   You may use over the counter chloraseptic spray and warm salt water gargles to help with sore throat.  Your strep test was negative.  You do no need antibiotics at this time as your symptoms are caused by a virus.  To find a primary care or specialty doctor please call [phone] or [phone] to access "[facility] Find a Doctor Service."  You may also go on the [facility] website at [email or web address]  There are also multiple [facility], [facility], [facility] and [facility] practices throughout the [place] that are frequently accepting new patients. You may find a clinic that is close to your home and contact them.  [facility] -  [address] [place] [address] [phone]   [facility] Department -  [address] [place] [address] [phone]   [facility] Department [phone]  [place] [address] [phone]

## 2018-05-10 NOTE — ED Provider Notes (Signed)
TIME SEEN: 4:58 AM  CHIEF COMPLAINT: sore throat  HPI: Patient is a 27 y.o. F with no significant PMH who presents to the ED with 2 days of subjective fevers, sore throat, mild dry cough and hoarse voice.  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  Did not take any medications prior to arrival.  ROS: See HPI Constitutional: Subjective fever  Eyes: no drainage  ENT: no runny nose   Cardiovascular:  no chest pain  Resp: no SOB  GI: no vomiting GU: no dysuria Integumentary: no rash  Allergy: no hives  Musculoskeletal: no leg swelling  Neurological: no slurred speech ROS otherwise negative  PAST MEDICAL HISTORY/PAST SURGICAL HISTORY:  History reviewed. No pertinent past medical history.  MEDICATIONS:  Prior to Admission medications   Medication Sig Start Date End Date Taking? Authorizing Provider  fluticasone (FLONASE) 50 MCG/ACT nasal spray Place 2 sprays into both nostrils daily. 05/03/17   Loren Racer, MD  ibuprofen (ADVIL,MOTRIN) 600 MG tablet Take 1 tablet (600 mg total) by mouth every 6 (six) hours as needed for moderate pain. 05/03/17   Loren Racer, MD  loratadine (CLARITIN) 10 MG tablet Take 1 tablet (10 mg total) by mouth daily. 05/03/17   Loren Racer, MD  loratadine (CLARITIN) 10 MG tablet Take 1 tablet (10 mg total) by mouth daily. 05/21/17   Kellie Shropshire, PA-C    ALLERGIES:  No Known Allergies  SOCIAL HISTORY:  Social History   Tobacco Use  . Smoking status: Never Smoker  . Smokeless tobacco: Never Used  Substance Use Topics  . Alcohol use: No    FAMILY HISTORY: No family history on file.  EXAM: BP 140/88 (BP Location: Left Arm)   Pulse 86   Temp 98.5 F (36.9 C) (Oral)   Resp 18   Ht 5\' 8"  (1.727 m)   Wt (!) 148.4 kg   SpO2 100%   BMI 49.74 kg/m  CONSTITUTIONAL: Alert and oriented and responds appropriately to questions. Well-appearing; well-nourished HEAD: Normocephalic EYES: Conjunctivae clear, pupils appear  equal, EOMI ENT: normal nose; moist mucous membranes; TMs are clear bilaterally without erythema, purulence, bulging, perforation, effusion.  No cerumen impaction or sign of foreign body in the external auditory canal. No inflammation, erythema or drainage from the external auditory canal. No signs of mastoiditis. No pain with manipulation of the pinna bilaterally.  No pharyngeal erythema or petechiae.  Minimal bilateral tonsillar hypertrophy but no accident.  Slightly hoarse voice but no muffled voice.  No uvular deviation, no unilateral swelling, no trismus or drooling, normal phonation, no stridor, no dental caries present, no drainable dental abscess noted, no Ludwig's angina, tongue sits flat in the bottom of the mouth, no angioedema, no facial erythema or warmth, no facial swelling; no pain with movement of the neck. NECK: Supple, no meningismus, no nuchal rigidity, no LAD  CARD: RRR; S1 and S2 appreciated; no murmurs, no clicks, no rubs, no gallops RESP: Normal chest excursion without splinting or tachypnea; breath sounds clear and equal bilaterally; no wheezes, no rhonchi, no rales, no hypoxia or respiratory distress, speaking full sentences ABD/GI: Normal bowel sounds; non-distended; soft, non-tender, no rebound, no guarding, no peritoneal signs, no hepatosplenomegaly BACK:  The back appears normal and is non-tender to palpation, there is no CVA tenderness EXT: Normal ROM in all joints; non-tender to palpation; no edema; normal capillary refill; no cyanosis, no calf tenderness or swelling    SKIN: Normal color for age and race; warm; no rash  NEURO: Moves all extremities equally PSYCH: The patient's mood and manner are appropriate. Grooming and personal hygiene are appropriate.  MEDICAL DECISION MAKING: Patient here with subjective fevers, sore throat, dry cough.  She is well-appearing on exam.  Strep test is negative.  No signs or symptoms of meningitis, pneumonia, deep space neck infection,  PTA, sepsis.  Recommended alternating Tylenol and Motrin at home, warm salt water gargles, Chloraseptic spray over-the-counter for symptomatic relief.  I do not feel she needs antibiotics at this time.   At this time, I do not feel there is any life-threatening condition present. I have reviewed and discussed all results (EKG, imaging, lab, urine as appropriate) and exam findings with patient/family. I have reviewed nursing notes and appropriate previous records.  I feel the patient is safe to be discharged home without further emergent workup and can continue workup as an outpatient as needed. Discussed usual and customary return precautions. Patient/family verbalize understanding and are comfortable with this plan.  Outpatient follow-up has been provided if needed. All questions have been answered.     Bronsyn Shappell, Layla Maw, DO 05/10/18 [phone]

## 2018-08-14 ENCOUNTER — Encounter (HOSPITAL_BASED_OUTPATIENT_CLINIC_OR_DEPARTMENT_OTHER): Payer: Self-pay | Admitting: Emergency Medicine

## 2018-08-14 ENCOUNTER — Emergency Department (HOSPITAL_BASED_OUTPATIENT_CLINIC_OR_DEPARTMENT_OTHER)
Admission: EM | Admit: 2018-08-14 | Discharge: 2018-08-14 | Disposition: A | Discharge status: Home / Self Care | Payer: Self-pay | Attending: Emergency Medicine | Admitting: Emergency Medicine

## 2018-08-14 ENCOUNTER — Other Ambulatory Visit: Payer: Self-pay

## 2018-08-14 DIAGNOSIS — M7918 Myalgia, other site: Secondary | ICD-10-CM | POA: Insufficient documentation

## 2018-08-14 DIAGNOSIS — J069 Acute upper respiratory infection, unspecified: Secondary | ICD-10-CM | POA: Insufficient documentation

## 2018-08-14 MED ORDER — IBUPROFEN 800 MG PO TABS
800.0000 mg | ORAL_TABLET | Freq: Once | ORAL | Status: AC
Start: 2018-08-14 — End: 2018-08-14
  Administered 2018-08-14: 800 mg via ORAL
  Filled 2018-08-14: qty 1

## 2018-08-14 NOTE — ED Provider Notes (Signed)
[facility] EMERGENCY DEPARTMENT Provider Note   CSN: [id number] Arrival date & time: 08/14/18  1836     History   Chief Complaint Chief Complaint  Patient presents with  . Fever    HPI Andrea Glover is a 28 y.o. female.  The history is provided by the patient.  Fever  Temp source:  Subjective Severity:  Mild Onset quality:  Gradual Timing:  Constant Progression:  Unchanged Chronicity:  New Relieved by:  Nothing Worsened by:  Nothing Ineffective treatments:  None tried Associated symptoms: chills and myalgias   Associated symptoms: no chest pain, no cough, no dysuria, no ear pain, no rash, no sore throat and no vomiting   Risk factors: sick contacts     History reviewed. No pertinent past medical history.  There are no active problems to display for this patient.   History reviewed. No pertinent surgical history.   OB History   No obstetric history on file.      Home Medications    Prior to Admission medications   Medication Sig Start Date End Date Taking? Authorizing Provider  fluticasone (FLONASE) 50 MCG/ACT nasal spray Place 2 sprays into both nostrils daily. 05/03/17   Loren Racer, MD  ibuprofen (ADVIL,MOTRIN) 600 MG tablet Take 1 tablet (600 mg total) by mouth every 6 (six) hours as needed for moderate pain. 05/03/17   Loren Racer, MD  loratadine (CLARITIN) 10 MG tablet Take 1 tablet (10 mg total) by mouth daily. 05/03/17   Loren Racer, MD  loratadine (CLARITIN) 10 MG tablet Take 1 tablet (10 mg total) by mouth daily. 05/21/17   Kellie Shropshire, PA-C    Family History History reviewed. No pertinent family history.  Social History Social History   Tobacco Use  . Smoking status: Never Smoker  . Smokeless tobacco: Never Used  Substance Use Topics  . Alcohol use: No  . Drug use: No     Allergies   Patient has no known allergies.   Review of Systems Review of Systems  Constitutional: Positive for chills and  fever.  HENT: Negative for ear pain and sore throat.   Eyes: Negative for pain and visual disturbance.  Respiratory: Negative for cough and shortness of breath.   Cardiovascular: Negative for chest pain and palpitations.  Gastrointestinal: Negative for abdominal pain and vomiting.  Genitourinary: Negative for dysuria and hematuria.  Musculoskeletal: Positive for myalgias. Negative for arthralgias and back pain.  Skin: Negative for color change and rash.  Neurological: Negative for seizures and syncope.  All other systems reviewed and are negative.    Physical Exam Updated Vital Signs BP 133/80 (BP Location: Right Arm)   Pulse (!) 114   Temp (!) 100.6 F (38.1 C) (Oral)   Resp 20   Ht 5\' 8"  (1.727 m)   Wt (!) 149.2 kg   LMP 07/25/2018   SpO2 96%   BMI 50.02 kg/m   Physical Exam Vitals signs and nursing note reviewed.  Constitutional:      General: She is not in acute distress.    Appearance: She is well-developed.  HENT:     Head: Normocephalic and atraumatic.     Right Ear: Tympanic membrane normal.     Left Ear: Tympanic membrane normal.     Nose: Nose normal.     Mouth/Throat:     Mouth: Mucous membranes are moist.     Pharynx: No oropharyngeal exudate or posterior oropharyngeal erythema.  Eyes:     Extraocular Movements: Extraocular  movements intact.     Conjunctiva/sclera: Conjunctivae normal.     Pupils: Pupils are equal, round, and reactive to light.  Neck:     Musculoskeletal: Neck supple.  Cardiovascular:     Rate and Rhythm: Regular rhythm. Tachycardia present.     Pulses: Normal pulses.     Heart sounds: Normal heart sounds. No murmur.  Pulmonary:     Effort: Pulmonary effort is normal. No respiratory distress.     Breath sounds: Normal breath sounds.  Abdominal:     General: There is no distension.     Palpations: Abdomen is soft.     Tenderness: There is no abdominal tenderness.  Musculoskeletal: Normal range of motion.  Skin:    General: Skin  is warm and dry.     Capillary Refill: Capillary refill takes less than 2 seconds.  Neurological:     General: No focal deficit present.     Mental Status: She is alert.  Psychiatric:        Mood and Affect: Mood normal.      ED Treatments / Results  Labs (all labs ordered are listed, but only abnormal results are displayed) Labs Reviewed - No data to display  EKG None  Radiology No results found.  Procedures Procedures (including critical care time)  Medications Ordered in ED Medications  ibuprofen (ADVIL,MOTRIN) tablet 800 mg (800 mg Oral Given 08/14/18 1848)     Initial Impression / Assessment and Plan / ED Course  I have reviewed the triage vital signs and the nursing notes.  Pertinent labs & imaging results that were available during my care of the patient were reviewed by me and considered in my medical decision making (see chart for details).     Andrea Glover is 28 year old female with no significant medical history who presents to the ED with fever, body aches.  Patient with mild fever, mild tachycardia upon arrival.  Otherwise normal vitals.  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  Developed body aches, fever just prior to arrival.  Has not taken any medication.  Denies any cough, sputum production.  Has clear breath sounds on exam.  No urinary symptoms.  No abdominal pain.  No abdominal tenderness on exam.  Suspect likely viral process.  Patient was given Motrin with improvement of symptoms.  Recommend increase hydration at home.  Recommend continued use of Tylenol, Motrin for body aches, fever.  Told to return to the ED if primary care doctor symptoms and fever do not resolve after 5 days.  Discharged in good condition.  This chart was dictated using voice recognition software.  Despite best efforts to proofread,  errors can occur which can change the documentation meaning.   Final Clinical Impressions(s) / ED Diagnoses   Final  diagnoses:  Upper respiratory tract infection, unspecified type    ED Discharge Orders    None       Virgina Norfolk, DO 08/14/18 1923

## 2018-08-14 NOTE — ED Notes (Signed)
ED Provider at bedside. 

## 2018-08-14 NOTE — ED Triage Notes (Addendum)
Reports woke up with sore throat this morning.  Additionally c/o fever, generalized body aches, and cough.

## 2019-10-17 ENCOUNTER — Emergency Department (HOSPITAL_BASED_OUTPATIENT_CLINIC_OR_DEPARTMENT_OTHER)
Admission: EM | Admit: 2019-10-17 | Discharge: 2019-10-17 | Disposition: A | Discharge status: Home / Self Care | Payer: Self-pay | Attending: Emergency Medicine | Admitting: Emergency Medicine

## 2019-10-17 ENCOUNTER — Encounter (HOSPITAL_BASED_OUTPATIENT_CLINIC_OR_DEPARTMENT_OTHER): Payer: Self-pay | Admitting: Emergency Medicine

## 2019-10-17 ENCOUNTER — Other Ambulatory Visit: Payer: Self-pay

## 2019-10-17 DIAGNOSIS — Y929 Unspecified place or not applicable: Secondary | ICD-10-CM | POA: Insufficient documentation

## 2019-10-17 DIAGNOSIS — Y999 Unspecified external cause status: Secondary | ICD-10-CM | POA: Insufficient documentation

## 2019-10-17 DIAGNOSIS — S61412A Laceration without foreign body of left hand, initial encounter: Secondary | ICD-10-CM | POA: Insufficient documentation

## 2019-10-17 DIAGNOSIS — Z23 Encounter for immunization: Secondary | ICD-10-CM | POA: Insufficient documentation

## 2019-10-17 DIAGNOSIS — W260XXA Contact with knife, initial encounter: Secondary | ICD-10-CM | POA: Insufficient documentation

## 2019-10-17 DIAGNOSIS — Y939 Activity, unspecified: Secondary | ICD-10-CM | POA: Insufficient documentation

## 2019-10-17 DIAGNOSIS — Z79899 Other long term (current) drug therapy: Secondary | ICD-10-CM | POA: Insufficient documentation

## 2019-10-17 MED ORDER — LIDOCAINE-EPINEPHRINE (PF) 2 %-1:200000 IJ SOLN
10.0000 mL | Freq: Once | INTRAMUSCULAR | Status: DC
Start: 2019-10-17 — End: 2019-10-17
  Filled 2019-10-17: qty 10

## 2019-10-17 MED ORDER — TETANUS-DIPHTH-ACELL PERTUSSIS 5-2.5-18.5 LF-MCG/0.5 IM SUSP
0.5000 mL | Freq: Once | INTRAMUSCULAR | Status: AC
  Administered 2019-10-17: 0.5 mL via INTRAMUSCULAR
  Filled 2019-10-17: qty 0.5

## 2019-10-17 NOTE — ED Provider Notes (Signed)
[facility] EMERGENCY DEPARTMENT Provider Note   CSN: [id number] Arrival date & time: 10/17/19  0055     History Chief Complaint  Patient presents with  . Extremity Laceration    Andrea Glover is a 29 y.o. female.  29 yo F with a chief complaints of a laceration to her left hand.  The patient was testing out some new knives and inadvertently cut herself.  She denies any other injury.  Denies foreign body.  Unknown last tetanus.  The history is provided by the patient.  Hand Injury Location:  Hand Hand location:  L hand Injury: yes   Time since incident:  20 minutes Mechanism of injury comment:  Laceration Pain details:    Quality:  Burning   Radiates to:  Does not radiate   Severity:  Mild   Onset quality:  Gradual   Duration:  1 hour   Timing:  Constant   Progression:  Unchanged Handedness:  Right-handed Dislocation: no   Foreign body present:  No foreign bodies Tetanus status:  Unknown Prior injury to area:  No Relieved by:  Nothing Worsened by:  Nothing Ineffective treatments:  None tried Associated symptoms: no fever        History reviewed. No pertinent past medical history.  There are no problems to display for this patient.   History reviewed. No pertinent surgical history.   OB History   No obstetric history on file.     No family history on file.  Social History   Tobacco Use  . Smoking status: Never Smoker  . Smokeless tobacco: Never Used  Substance Use Topics  . Alcohol use: No  . Drug use: No    Home Medications Prior to Admission medications   Medication Sig Start Date End Date Taking? Authorizing Provider  fluticasone (FLONASE) 50 MCG/ACT nasal spray Place 2 sprays into both nostrils daily. 05/03/17   Loren Racer, MD  ibuprofen (ADVIL,MOTRIN) 600 MG tablet Take 1 tablet (600 mg total) by mouth every 6 (six) hours as needed for moderate pain. 05/03/17   Loren Racer, MD  loratadine (CLARITIN) 10 MG tablet Take  1 tablet (10 mg total) by mouth daily. 05/03/17   Loren Racer, MD  loratadine (CLARITIN) 10 MG tablet Take 1 tablet (10 mg total) by mouth daily. 05/21/17   Kellie Shropshire, PA-C    Allergies    Patient has no known allergies.  Review of Systems   Review of Systems  Constitutional: Negative for chills and fever.  HENT: Negative for congestion and rhinorrhea.   Eyes: Negative for redness and visual disturbance.  Respiratory: Negative for shortness of breath and wheezing.   Cardiovascular: Negative for chest pain and palpitations.  Gastrointestinal: Negative for nausea and vomiting.  Genitourinary: Negative for dysuria and urgency.  Musculoskeletal: Negative for arthralgias and myalgias.  Skin: Positive for wound. Negative for pallor.  Neurological: Negative for dizziness and headaches.    Physical Exam Updated Vital Signs BP 125/69 (BP Location: Right Arm)   Pulse 93   Temp 98.5 F (36.9 C) (Oral)   Resp 16   Ht 5\' 8"  (1.727 m)   Wt (!) 149.7 kg   LMP 10/14/2019   SpO2 99%   BMI 50.18 kg/m   Physical Exam Vitals and nursing note reviewed.  Constitutional:      General: She is not in acute distress.    Appearance: She is well-developed. She is not diaphoretic.  HENT:     Head: Normocephalic and atraumatic.  Eyes:     Pupils: Pupils are equal, round, and reactive to light.  Cardiovascular:     Rate and Rhythm: Normal rate and regular rhythm.  Pulmonary:     Effort: Pulmonary effort is normal.  Musculoskeletal:        General: No tenderness.     Cervical back: Normal range of motion and neck supple.     Comments: Laceration to the webspace between the first and second digit of the left hand.  Full range of motion of the first second digit.  Wound appears to be superficial just to the upper layer of skin.  It is gaping.  Approximately 2 cm.  Skin:    General: Skin is warm and dry.  Neurological:     Mental Status: She is alert and oriented to person, place,  and time.  Psychiatric:        Behavior: Behavior normal.     ED Results / Procedures / Treatments   Labs (all labs ordered are listed, but only abnormal results are displayed) Labs Reviewed - No data to display  EKG None  Radiology No results found.  Procedures .Marland KitchenLaceration Repair  Date/Time: 10/17/2019 1:43 AM Performed by: Deno Etienne, DO Authorized by: Deno Etienne, DO   Consent:    Consent obtained:  Verbal   Consent given by:  Patient   Risks discussed:  Infection, pain, poor cosmetic result and poor wound healing   Alternatives discussed:  No treatment, delayed treatment and observation Anesthesia (see MAR for exact dosages):    Anesthesia method:  Local infiltration   Local anesthetic:  Lidocaine 2% WITH epi Laceration details:    Location:  Hand   Hand location:  L hand, dorsum   Length (cm):  2.7 Repair type:    Repair type:  Simple Pre-procedure details:    Preparation:  Patient was prepped and draped in usual sterile fashion Exploration:    Hemostasis achieved with:  Direct pressure and epinephrine   Wound exploration: entire depth of wound probed and visualized     Wound extent: no muscle damage noted and no tendon damage noted     Contaminated: no   Treatment:    Wound cleansed with: Chlorhexidine.   Amount of cleaning:  Extensive   Irrigation solution:  Tap water   Irrigation volume:  Approximately 5 minutes of tap   Irrigation method:  Tap   Visualized foreign bodies/material removed: no   Skin repair:    Repair method:  Sutures   Suture size:  4-0   Suture material:  Nylon   Suture technique:  Simple interrupted   Number of sutures:  3 Approximation:    Approximation:  Close Post-procedure details:    Dressing:  Open (no dressing)   Patient tolerance of procedure:  Tolerated well, no immediate complications   (including critical care time)  Medications Ordered in ED Medications  lidocaine-EPINEPHrine (XYLOCAINE W/EPI) 2 %-1:200000 (PF)  injection 10 mL (has no administration in time range)  Tdap (BOOSTRIX) injection 0.5 mL (0.5 mLs Intramuscular Given 10/17/19 0125)    ED Course  I have reviewed the triage vital signs and the nursing notes.  Pertinent labs & imaging results that were available during my care of the patient were reviewed by me and considered in my medical decision making (see chart for details).    MDM Rules/Calculators/A&P                      29 yo F  with a chief complaints of a hand laceration.  Will suture at bedside.  1:45 AM:  I have discussed the diagnosis/risks/treatment options with the patient and friend and believe the pt to be eligible for discharge home to follow-up with PCP. We also discussed returning to the ED immediately if new or worsening sx occur. We discussed the sx which are most concerning (e.g., sudden worsening pain, fever, inability to tolerate by mouth) that necessitate immediate return. Medications administered to the patient during their visit and any new prescriptions provided to the patient are listed below.  Medications given during this visit Medications  lidocaine-EPINEPHrine (XYLOCAINE W/EPI) 2 %-1:200000 (PF) injection 10 mL (has no administration in time range)  Tdap (BOOSTRIX) injection 0.5 mL (0.5 mLs Intramuscular Given 10/17/19 0125)     The patient appears reasonably screen and/or stabilized for discharge and I doubt any other medical condition or other [facility] requiring further screening, evaluation, or treatment in the ED at this time prior to discharge.   Final Clinical Impression(s) / ED Diagnoses Final diagnoses:  Laceration of left hand without foreign body, initial encounter    Rx / DC Orders ED Discharge Orders    None       Melene Plan, DO 10/17/19 0145

## 2019-10-17 NOTE — Discharge Instructions (Signed)
Return for redness drainage or fever.  The stitches did not dissolve the need to be removed in somewhere between 7 and 10 days.  You can have this done here or at urgent care or at your family doctor's office.  You can get the area wet but do not fully get it under water.  Do not scrub the area.

## 2019-10-17 NOTE — ED Triage Notes (Signed)
Cut left inner hand on kitchen knife. PTA. Bleeding controlled.

## 2019-10-26 ENCOUNTER — Other Ambulatory Visit: Payer: Self-pay

## 2019-10-26 ENCOUNTER — Encounter (HOSPITAL_BASED_OUTPATIENT_CLINIC_OR_DEPARTMENT_OTHER): Payer: Self-pay | Admitting: Emergency Medicine

## 2019-10-26 ENCOUNTER — Emergency Department (HOSPITAL_BASED_OUTPATIENT_CLINIC_OR_DEPARTMENT_OTHER)
Admission: EM | Admit: 2019-10-26 | Discharge: 2019-10-26 | Disposition: A | Discharge status: Home / Self Care | Payer: Self-pay | Attending: Emergency Medicine | Admitting: Emergency Medicine

## 2019-10-26 DIAGNOSIS — X58XXXD Exposure to other specified factors, subsequent encounter: Secondary | ICD-10-CM | POA: Insufficient documentation

## 2019-10-26 DIAGNOSIS — Z79899 Other long term (current) drug therapy: Secondary | ICD-10-CM | POA: Insufficient documentation

## 2019-10-26 DIAGNOSIS — Z5189 Encounter for other specified aftercare: Secondary | ICD-10-CM

## 2019-10-26 DIAGNOSIS — S61412D Laceration without foreign body of left hand, subsequent encounter: Secondary | ICD-10-CM | POA: Insufficient documentation

## 2019-10-26 NOTE — ED Triage Notes (Signed)
Pt here for suture removal from L hand.

## 2019-10-26 NOTE — ED Provider Notes (Signed)
[facility] EMERGENCY DEPARTMENT Provider Note   CSN: [id number] Arrival date & time: 10/26/19  1111     History Chief Complaint  Patient presents with  . Suture / Staple Removal    Andrea Glover is a 29 y.o. female.  Patient is a 29 year old female who presents for suture removal.  She has sutures placed in her left hand on March 25.  She has a laceration to the webspace between her thumb and first finger.  She has been doing well with no problems.  Her tetanus shot was updated on her last visit.        History reviewed. No pertinent past medical history.  There are no problems to display for this patient.   History reviewed. No pertinent surgical history.   OB History   No obstetric history on file.     No family history on file.  Social History   Tobacco Use  . Smoking status: Never Smoker  . Smokeless tobacco: Never Used  Substance Use Topics  . Alcohol use: No  . Drug use: No    Home Medications Prior to Admission medications   Medication Sig Start Date End Date Taking? Authorizing Provider  fluticasone (FLONASE) 50 MCG/ACT nasal spray Place 2 sprays into both nostrils daily. 05/03/17   Julianne Rice, MD  ibuprofen (ADVIL,MOTRIN) 600 MG tablet Take 1 tablet (600 mg total) by mouth every 6 (six) hours as needed for moderate pain. 05/03/17   Julianne Rice, MD  loratadine (CLARITIN) 10 MG tablet Take 1 tablet (10 mg total) by mouth daily. 05/03/17   Julianne Rice, MD  loratadine (CLARITIN) 10 MG tablet Take 1 tablet (10 mg total) by mouth daily. 05/21/17   Glyn Ade, PA-C    Allergies    Patient has no known allergies.  Review of Systems   Review of Systems  Constitutional: Negative for fever.  Gastrointestinal: Negative for nausea and vomiting.  Musculoskeletal: Negative for arthralgias, back pain, joint swelling and neck pain.  Skin: Positive for wound.  Neurological: Negative for weakness, numbness and headaches.     Physical Exam Updated Vital Signs BP (!) 113/57 (BP Location: Right Arm)   Pulse 64   Temp 98.5 F (36.9 C) (Oral)   Resp 18   LMP 10/14/2019   SpO2 98%   Physical Exam Constitutional:      Appearance: She is well-developed.  HENT:     Head: Normocephalic and atraumatic.  Cardiovascular:     Rate and Rhythm: Normal rate.  Pulmonary:     Effort: Pulmonary effort is normal.  Musculoskeletal:        General: No tenderness.     Cervical back: Normal range of motion and neck supple.     Comments: Patient has a healing laceration to the webspace between her thumb and first finger of her left hand.  There is no redness, drainage or signs of infection.  Skin:    General: Skin is warm and dry.  Neurological:     Mental Status: She is alert and oriented to person, place, and time.     ED Results / Procedures / Treatments   Labs (all labs ordered are listed, but only abnormal results are displayed) Labs Reviewed - No data to display  EKG None  Radiology No results found.  Procedures Procedures (including critical care time)  Medications Ordered in ED Medications - No data to display  ED Course  I have reviewed the triage vital signs and the nursing  notes.  Pertinent labs & imaging results that were available during my care of the patient were reviewed by me and considered in my medical decision making (see chart for details).    MDM Rules/Calculators/A&P                      I did remove the middle suture.  There is 3 sutures in place.  However when I removed this, the wound seems to open up a little bit.  I advised her that it would be better to leave the sutures in for about 4-5 more days and have them removed at that time.  There is no signs of infection.  I advised her to either return here or in urgent care for removal of the remaining sutures. Final Clinical Impression(s) / ED Diagnoses Final diagnoses:  Visit for wound check    Rx / DC Orders ED  Discharge Orders    None       Rolan Bucco, MD 10/26/19 1140

## 2019-10-26 NOTE — ED Notes (Signed)
Suture removal kit at bedside 

## 2020-11-05 ENCOUNTER — Encounter (HOSPITAL_BASED_OUTPATIENT_CLINIC_OR_DEPARTMENT_OTHER): Payer: Self-pay | Admitting: *Deleted

## 2020-11-05 ENCOUNTER — Other Ambulatory Visit: Payer: Self-pay

## 2020-11-05 ENCOUNTER — Emergency Department (HOSPITAL_BASED_OUTPATIENT_CLINIC_OR_DEPARTMENT_OTHER)
Admission: EM | Admit: 2020-11-05 | Discharge: 2020-11-05 | Disposition: A | Discharge status: Home / Self Care | Payer: Self-pay | Attending: Emergency Medicine | Admitting: Emergency Medicine

## 2020-11-05 DIAGNOSIS — M7711 Lateral epicondylitis, right elbow: Secondary | ICD-10-CM | POA: Insufficient documentation

## 2020-11-05 MED ORDER — NAPROXEN 500 MG PO TABS: 500.0000 mg | ORAL_TABLET | Freq: Two times a day (BID) | ORAL | 0 refills | Status: DC

## 2020-11-05 NOTE — ED Provider Notes (Signed)
[facility] EMERGENCY DEPARTMENT Provider Note   CSN: [id number] Arrival date & time: 11/05/20  1937     History Chief Complaint  Patient presents with  . Elbow Pain    Andrea Glover is a 30 y.o. female.  Patient presents with follow-up elbow pain.  She reports that this started about 1 month ago, lasted for 2 weeks, then resolved.  She states that this started acutely again yesterday.  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  She states that it hurts to straighten her arm out.  She feels the pain on the lateral aspect of her arm.  She has not tried anything for the pain.  She states that she was having difficulty sleeping last night due to pain.  She denies any radiation of pain.  Has not noticed any swelling, redness, fevers.  No prior history of injury.  She has not had this happen before.        History reviewed. No pertinent past medical history.  There are no problems to display for this patient.   History reviewed. No pertinent surgical history.   OB History   No obstetric history on file.     No family history on file.  Social History   Tobacco Use  . Smoking status: Never Smoker  . Smokeless tobacco: Never Used  Substance Use Topics  . Alcohol use: No  . Drug use: Yes    Types: Marijuana    Home Medications Prior to Admission medications   Medication Sig Start Date End Date Taking? Authorizing Provider  loratadine (CLARITIN) 10 MG tablet Take 1 tablet (10 mg total) by mouth daily. 05/21/17  Yes Kellie Shropshire, PA-C  naproxen (NAPROSYN) 500 MG tablet Take 1 tablet (500 mg total) by mouth 2 (two) times daily with a meal. 11/05/20  Yes Marbeth Smedley, Solmon Ice, DO  fluticasone (FLONASE) 50 MCG/ACT nasal spray Place 2 sprays into both nostrils daily. 05/03/17   Loren Racer, MD  loratadine (CLARITIN) 10 MG tablet Take 1 tablet (10 mg total) by mouth daily. 05/03/17   Loren Racer, MD     Allergies    Patient has no known allergies.  Review of Systems   Review of Systems  Constitutional: Negative for chills and fever.  Respiratory: Negative for shortness of breath.   Cardiovascular: Negative for chest pain.  Musculoskeletal: Negative for joint swelling.       Right lateral elbow pain  Skin: Negative for color change.  Neurological: Negative for weakness and numbness.    Physical Exam Updated Vital Signs BP 129/76 (BP Location: Right Arm)   Pulse 89   Temp 98.7 F (37.1 C) (Oral)   Resp 20   Ht 5\' 8"  (1.727 m)   Wt (!) 143.1 kg   LMP 10/13/2020   SpO2 100%   BMI 47.97 kg/m   Physical Exam Constitutional:      Appearance: Normal appearance.  HENT:     Head: Normocephalic and atraumatic.  Pulmonary:     Effort: Pulmonary effort is normal. No respiratory distress.  Neurological:     Mental Status: She is alert.    Right Elbow: - Inspection: no obvious deformity. No swelling, erythema or bruising - Palpation: TTP of right radial head - ROM: full active ROM in flexion and extension, pain with full extension. No crepitus - Strength: 5/5 strength in wrist flexion and extension without pain b/l. 5/5 strength in biceps, triceps b/l -  Neuro: NV intact distally b/l - Special testing: no laxity with varus/valgus stress, negative milking maneuver.Pain with resisted ECRB, no pain with resisted supination. Negative tinel's at radial tunnel and cubital tunnel on right    ED Results / Procedures / Treatments   Labs (all labs ordered are listed, but only abnormal results are displayed) Labs Reviewed - No data to display  EKG None  Radiology No results found.  Procedures Procedures   Medications Ordered in ED Medications - No data to display  ED Course  I have reviewed the triage vital signs and the nursing notes.  Pertinent labs & imaging results that were available during my care of the patient were reviewed by me and considered in my medical  decision making (see chart for details).    MDM Rules/Calculators/A&P                          Patient is a 30 year old previously healthy female who works as a Interior and spatial designer who presents with right lateral elbow pain.  This started about a month ago, occurred for about 2 weeks, then improved and started again yesterday.  She is a Interior and spatial designer and performs repetitive motions with her arms.  No history of injury.  Exam and history is consistent with lateral epicondylitis.  Discussed with patient that there is no need for imaging at this time.  She will take naproxen 500 mg twice daily for 1 week, then use twice daily with food as needed.  We will have her follow-up with sports medicine.  She can ice the area as well.  Advised her to rest it is much as possible.  Patient was discharged home in stable condition with stable vital signs.  Final Clinical Impression(s) / ED Diagnoses Final diagnoses:  Lateral epicondylitis of right elbow    Rx / DC Orders ED Discharge Orders         Ordered    naproxen (NAPROSYN) 500 MG tablet  2 times daily with meals        11/05/20 2014           MeccarielloSolmon Ice, DO 11/05/20 2021    Little, Ambrose Finland, MD 11/08/20 2102

## 2020-11-05 NOTE — Discharge Instructions (Signed)
Take naproxen twice daily with meals for one week.  You can then take up to twice daily as needed for pain.  Do not take other anti-inflammatories like ibuprofen or Aleve with this.  Ice the area as well.  Try to rest it as much as you can.  Please call the Sports Medicine Doctor to schedule an appointment in about 1-2 weeks to monitor.  If this worsens, you should see your doctor sooner.

## 2020-11-05 NOTE — ED Triage Notes (Signed)
Right elbow pain for a month. No injury

## 2021-02-01 ENCOUNTER — Other Ambulatory Visit: Payer: Self-pay

## 2021-02-01 ENCOUNTER — Emergency Department (HOSPITAL_BASED_OUTPATIENT_CLINIC_OR_DEPARTMENT_OTHER)
Admission: EM | Admit: 2021-02-01 | Discharge: 2021-02-01 | Disposition: A | Discharge status: Home / Self Care | Payer: Self-pay | Attending: Emergency Medicine | Admitting: Emergency Medicine

## 2021-02-01 ENCOUNTER — Encounter (HOSPITAL_BASED_OUTPATIENT_CLINIC_OR_DEPARTMENT_OTHER): Payer: Self-pay | Admitting: Emergency Medicine

## 2021-02-01 DIAGNOSIS — I10 Essential (primary) hypertension: Secondary | ICD-10-CM | POA: Insufficient documentation

## 2021-02-01 DIAGNOSIS — K047 Periapical abscess without sinus: Secondary | ICD-10-CM | POA: Insufficient documentation

## 2021-02-01 NOTE — Discharge Instructions (Addendum)
You were seen in the emergency department for some jaw pain and tooth ache.  There is likely a dental infection of the beginnings of an abscess.  You can use ibuprofen for pain.  Warm salt water gargles after eating.  Finish your antibiotics.  Please contact Dr. Mayford Knife dentist who is on-call for [place] today.

## 2021-02-01 NOTE — ED Notes (Signed)
ED Provider at bedside. 

## 2021-02-01 NOTE — ED Triage Notes (Signed)
Pt arrives pov with c/o oral abscess on right buccal. Pt reports tele health assessment and rx for abx, wants confirmation by provider.

## 2021-02-01 NOTE — ED Provider Notes (Signed)
[facility] EMERGENCY DEPARTMENT Provider Note   CSN: [id number] Arrival date & time: 02/01/21  1144     History Chief Complaint  Patient presents with   Abscess    Andrea Glover is a 30 y.o. female.  She is here with a complaint of facial swelling and dental pain for about a week.  She thinks it is arising from a cracked tooth.  Some sensitivity with chewing.  No fevers.  No difficulty swallowing.  She did a televisit and they prescribed her Augmentin but she is not sure it seems to be helping much.  The history is provided by the patient.  Dental Pain Location:  Lower Lower teeth location:  30/RL 1st molar Quality:  Throbbing Severity:  Moderate Onset quality:  Gradual Duration:  1 week Progression:  Unchanged Chronicity:  New Context: dental fracture   Relieved by:  Nothing Worsened by:  Jaw movement and pressure Associated symptoms: facial pain and facial swelling   Associated symptoms: no difficulty swallowing, no fever, no headaches, no oral bleeding and no trismus   Risk factors: lack of dental care       History reviewed. No pertinent past medical history.  There are no problems to display for this patient.   History reviewed. No pertinent surgical history.   OB History   No obstetric history on file.     History reviewed. No pertinent family history.  Social History   Tobacco Use   Smoking status: Never   Smokeless tobacco: Never  Substance Use Topics   Alcohol use: No   Drug use: Yes    Types: Marijuana    Home Medications Prior to Admission medications   Medication Sig Start Date End Date Taking? Authorizing Provider  fluticasone (FLONASE) 50 MCG/ACT nasal spray Place 2 sprays into both nostrils daily. 05/03/17   Loren Racer, MD  loratadine (CLARITIN) 10 MG tablet Take 1 tablet (10 mg total) by mouth daily. 05/03/17   Loren Racer, MD  loratadine (CLARITIN) 10 MG tablet Take 1 tablet (10 mg total) by mouth daily. 05/21/17    Kellie Shropshire, PA-C  naproxen (NAPROSYN) 500 MG tablet Take 1 tablet (500 mg total) by mouth 2 (two) times daily with a meal. 11/05/20   Meccariello, Solmon Ice, DO    Allergies    Patient has no known allergies.  Review of Systems   Review of Systems  Constitutional:  Negative for fever.  HENT:  Positive for facial swelling.   Skin:  Negative for wound.  Neurological:  Negative for headaches.   Physical Exam Updated Vital Signs BP 110/67 (BP Location: Right Arm)   Pulse 62   Temp 98.7 F (37.1 C) (Oral)   Resp 18   Ht 5\' 7"  (1.702 m)   Wt 133.8 kg   LMP 01/29/2021   SpO2 100%   BMI 46.20 kg/m   Physical Exam Constitutional:      Appearance: Normal appearance. She is well-developed.  HENT:     Head: Normocephalic and atraumatic.     Mouth/Throat:     Comments: She has fair dentition.  There are some tenderness in front of her lower first molar on the right on the outer gingiva.  No fluctuance.  No trismus. Eyes:     Conjunctiva/sclera: Conjunctivae normal.  Cardiovascular:     Rate and Rhythm: Normal rate and regular rhythm.  Musculoskeletal:     Cervical back: Neck supple.  Skin:    General: Skin is warm and dry.  Neurological:     General: No focal deficit present.     Mental Status: She is alert.     GCS: GCS eye subscore is 4. GCS verbal subscore is 5. GCS motor subscore is 6.    ED Results / Procedures / Treatments   Labs (all labs ordered are listed, but only abnormal results are displayed) Labs Reviewed - No data to display  EKG None  Radiology No results found.  Procedures Procedures   Medications Ordered in ED Medications - No data to display  ED Course  I have reviewed the triage vital signs and the nursing notes.  Pertinent labs & imaging results that were available during my care of the patient were reviewed by me and considered in my medical decision making (see chart for details).    MDM Rules/Calculators/A&P                          30 year old female here with facial and dental pain.  Clinically has dental infection probable early abscess.  Not clinically toxic, hypertensive.  No signs of cellulitis.  No trismus.  Recommended continue antibiotics and dental follow-up.  Dental on-call information given.  Return instructions discussed.  Final Clinical Impression(s) / ED Diagnoses Final diagnoses:  Dental abscess  Hypertension, unspecified type    Rx / DC Orders ED Discharge Orders     None        Terrilee Files, MD 02/01/21 1738

## 2021-04-23 ENCOUNTER — Other Ambulatory Visit: Payer: Self-pay

## 2021-04-23 ENCOUNTER — Emergency Department (HOSPITAL_BASED_OUTPATIENT_CLINIC_OR_DEPARTMENT_OTHER)
Admission: EM | Admit: 2021-04-23 | Discharge: 2021-04-23 | Disposition: A | Discharge status: Home / Self Care | Payer: Self-pay | Attending: Emergency Medicine | Admitting: Emergency Medicine

## 2021-04-23 ENCOUNTER — Other Ambulatory Visit (HOSPITAL_BASED_OUTPATIENT_CLINIC_OR_DEPARTMENT_OTHER): Payer: Self-pay

## 2021-04-23 ENCOUNTER — Encounter (HOSPITAL_BASED_OUTPATIENT_CLINIC_OR_DEPARTMENT_OTHER): Payer: Self-pay | Admitting: *Deleted

## 2021-04-23 ENCOUNTER — Emergency Department (HOSPITAL_BASED_OUTPATIENT_CLINIC_OR_DEPARTMENT_OTHER): Payer: Self-pay

## 2021-04-23 DIAGNOSIS — M722 Plantar fascial fibromatosis: Secondary | ICD-10-CM | POA: Insufficient documentation

## 2021-04-23 MED ORDER — DICLOFENAC SODIUM 75 MG PO TBEC
75.0000 mg | DELAYED_RELEASE_TABLET | Freq: Two times a day (BID) | ORAL | 0 refills | Status: AC
  Filled 2021-04-23: qty 20, 10d supply, fill #0

## 2021-04-23 NOTE — ED Provider Notes (Signed)
[facility] EMERGENCY DEPARTMENT Provider Note   CSN: [id number] Arrival date & time: 04/23/21  1150     History Chief Complaint  Patient presents with   Foot Pain    Andrea Glover is a 29 y.o. female.  The history is provided by the patient. No language interpreter was used.  Foot Pain This is a new problem. Episode onset: 4 days. The problem occurs constantly. The problem has not changed since onset.Nothing aggravates the symptoms. Nothing relieves the symptoms. She has tried nothing for the symptoms. The treatment provided no relief.  Pt complains of pain in the heel of her foot.  Pt complains of pain with trying to walk.     History reviewed. No pertinent past medical history.  There are no problems to display for this patient.   History reviewed. No pertinent surgical history.   OB History   No obstetric history on file.     No family history on file.  Social History   Tobacco Use   Smoking status: Never   Smokeless tobacco: Never  Vaping Use   Vaping Use: Never used  Substance Use Topics   Alcohol use: No   Drug use: Yes    Types: Marijuana    Home Medications Prior to Admission medications   Medication Sig Start Date End Date Taking? Authorizing Provider  diclofenac (VOLTAREN) 75 MG EC tablet Take 1 tablet (75 mg total) by mouth 2 (two) times daily. 04/23/21  Yes Cheron Schaumann K, PA-C  fluticasone (FLONASE) 50 MCG/ACT nasal spray Place 2 sprays into both nostrils daily. 05/03/17   Loren Racer, MD  loratadine (CLARITIN) 10 MG tablet Take 1 tablet (10 mg total) by mouth daily. 05/03/17   Loren Racer, MD  loratadine (CLARITIN) 10 MG tablet Take 1 tablet (10 mg total) by mouth daily. 05/21/17   Kellie Shropshire, PA-C    Allergies    Patient has no known allergies.  Review of Systems   Review of Systems  All other systems reviewed and are negative.  Physical Exam Updated Vital Signs BP (!) 144/66 (BP Location: Left Arm)    Pulse 75   Temp 98 F (36.7 C) (Oral)   Resp 16   Ht 5\' 7"  (1.702 m)   Wt 134.2 kg   SpO2 99%   BMI 46.34 kg/m   Physical Exam Vitals reviewed.  Cardiovascular:     Rate and Rhythm: Normal rate.  Pulmonary:     Effort: Pulmonary effort is normal.  Musculoskeletal:        General: Tenderness present. No deformity.     Comments: Tender heel of right foot,  nv and ns intact  Skin:    General: Skin is warm.  Neurological:     General: No focal deficit present.  Psychiatric:        Mood and Affect: Mood normal.    ED Results / Procedures / Treatments   Labs (all labs ordered are listed, but only abnormal results are displayed) Labs Reviewed - No data to display  EKG None  Radiology DG Foot Complete Right  Result Date: 04/23/2021 CLINICAL DATA:  Right foot pain for 4 days. No specific injury. EXAM: RIGHT FOOT COMPLETE - 3+ VIEW COMPARISON:  None. FINDINGS: The joint spaces are maintained. No acute bony findings or bone lesion. IMPRESSION: No acute bony findings. Electronically Signed   By: 04/25/2021 M.D.   On: 04/23/2021 13:08    Procedures Procedures   Medications Ordered in ED  Medications - No data to display  ED Course  I have reviewed the triage vital signs and the nursing notes.  Pertinent labs & imaging results that were available during my care of the patient were reviewed by me and considered in my medical decision making (see chart for details).    MDM Rules/Calculators/A&P                           MDM:  Xray  normal.  I suspect plantar fascitis.  Pt counseled on treatment  Final Clinical Impression(s) / ED Diagnoses Final diagnoses:  Plantar fasciitis    Rx / DC Orders ED Discharge Orders          Ordered    diclofenac (VOLTAREN) 75 MG EC tablet  2 times daily        04/23/21 1342          An After Visit Summary was printed and given to the patient.    Elson Areas, [place] 04/23/21 1942    Milagros Loll, MD 04/26/21  [phone]

## 2021-04-23 NOTE — ED Notes (Signed)
Provider exited room- requesting ortho shoe. Tech at bedside to assist.

## 2021-04-23 NOTE — ED Triage Notes (Signed)
Right foot pain x 4 days. No known injury. Pain is worse at night and when she gets out of bed in the mornings.

## 2021-04-23 NOTE — ED Notes (Signed)
No acute distress noted upon this RN's departure of patient. Verified discharge paperwork with name and DOB. Vital signs stable. Patient fit for a ortho shoe and paperwork completed per protocol. +PSCM noted uopn assessment. Patient taken to checkout window. Discharge paperwork discussed with patient. No further questions voiced upon discharge.

## 2021-04-23 NOTE — Discharge Instructions (Signed)
Return if any problems.

## 2021-05-03 ENCOUNTER — Other Ambulatory Visit (HOSPITAL_BASED_OUTPATIENT_CLINIC_OR_DEPARTMENT_OTHER): Payer: Self-pay

## 2021-08-09 ENCOUNTER — Emergency Department (HOSPITAL_BASED_OUTPATIENT_CLINIC_OR_DEPARTMENT_OTHER)
Admission: EM | Admit: 2021-08-09 | Discharge: 2021-08-09 | Disposition: A | Discharge status: Home / Self Care | Payer: Self-pay | Attending: Emergency Medicine | Admitting: Emergency Medicine

## 2021-08-09 ENCOUNTER — Encounter (HOSPITAL_BASED_OUTPATIENT_CLINIC_OR_DEPARTMENT_OTHER): Payer: Self-pay

## 2021-08-09 ENCOUNTER — Other Ambulatory Visit: Payer: Self-pay

## 2021-08-09 DIAGNOSIS — R0981 Nasal congestion: Secondary | ICD-10-CM | POA: Insufficient documentation

## 2021-08-09 DIAGNOSIS — N941 Unspecified dyspareunia: Secondary | ICD-10-CM | POA: Insufficient documentation

## 2021-08-09 DIAGNOSIS — Z20822 Contact with and (suspected) exposure to covid-19: Secondary | ICD-10-CM | POA: Insufficient documentation

## 2021-08-09 DIAGNOSIS — R059 Cough, unspecified: Secondary | ICD-10-CM | POA: Insufficient documentation

## 2021-08-09 DIAGNOSIS — J029 Acute pharyngitis, unspecified: Secondary | ICD-10-CM | POA: Insufficient documentation

## 2021-08-09 DIAGNOSIS — B379 Candidiasis, unspecified: Secondary | ICD-10-CM

## 2021-08-09 DIAGNOSIS — N898 Other specified noninflammatory disorders of vagina: Secondary | ICD-10-CM

## 2021-08-09 DIAGNOSIS — B3731 Acute candidiasis of vulva and vagina: Secondary | ICD-10-CM | POA: Insufficient documentation

## 2021-08-09 LAB — URINALYSIS, ROUTINE W REFLEX MICROSCOPIC
Bilirubin Urine: NEGATIVE
Glucose, UA: NEGATIVE mg/dL
Hgb urine dipstick: NEGATIVE
Ketones, ur: NEGATIVE mg/dL
Nitrite: NEGATIVE
Protein, ur: NEGATIVE mg/dL
Specific Gravity, Urine: 1.025 (ref 1.005–1.030)
pH: 7 (ref 5.0–8.0)

## 2021-08-09 LAB — WET PREP, GENITAL
Clue Cells Wet Prep HPF POC: NONE SEEN
Sperm: NONE SEEN
Trich, Wet Prep: NONE SEEN
WBC, Wet Prep HPF POC: <10 (ref <10)
Yeast Wet Prep HPF POC: NONE SEEN

## 2021-08-09 LAB — RESP PANEL BY RT-PCR (FLU A&B, COVID) ARPGX2
Influenza A by PCR: NEGATIVE
Influenza B by PCR: NEGATIVE
SARS Coronavirus 2 by RT PCR: NEGATIVE

## 2021-08-09 LAB — URINALYSIS, MICROSCOPIC (REFLEX)

## 2021-08-09 LAB — PREGNANCY, URINE: Preg Test, Ur: NEGATIVE

## 2021-08-09 MED ORDER — FLUCONAZOLE 200 MG PO TABS: 200.0000 mg | ORAL_TABLET | Freq: Every day | ORAL | 0 refills | Status: AC

## 2021-08-09 NOTE — ED Provider Notes (Signed)
[facility] EMERGENCY DEPARTMENT Provider Note   CSN: [id number] Arrival date & time: 08/09/21  1129     History  Chief Complaint  Patient presents with   Cough   Vaginal Discharge    Andrea Glover is a 31 y.o. female with no significant PMH who presents with two distinct complaints. First, she endorses some left sided congestion, sinus pressure, sore throat, and worry about swelling. Next, she endorses some white to yellow vaginal discharge for the last two weeks, denies odor, does endorse some dyspareunia. Is sexually active, no active concern for STI, no hx of STI. LMP just finished 1/12. Denies, dysuria, hematuria, abdominal pain.   Cough Associated symptoms: sore throat   Vaginal Discharge     Home Medications Prior to Admission medications   Medication Sig Start Date End Date Taking? Authorizing Provider  diclofenac (VOLTAREN) 75 MG EC tablet Take 1 tablet (75 mg total) by mouth 2 (two) times daily. 04/23/21   Fransico Meadow, PA-C  fluticasone (FLONASE) 50 MCG/ACT nasal spray Place 2 sprays into both nostrils daily. 05/03/17   Julianne Rice, MD  loratadine (CLARITIN) 10 MG tablet Take 1 tablet (10 mg total) by mouth daily. 05/03/17   Julianne Rice, MD  loratadine (CLARITIN) 10 MG tablet Take 1 tablet (10 mg total) by mouth daily. 05/21/17   Glyn Ade, PA-C      Allergies    Patient has no known allergies.    Review of Systems   Review of Systems  HENT:  Positive for sore throat.   Respiratory:  Positive for cough.   Genitourinary:  Positive for vaginal discharge.  All other systems reviewed and are negative.  Physical Exam Updated Vital Signs BP 114/81 (BP Location: Left Arm)    Pulse 84    Temp 98.7 F (37.1 C) (Oral)    Resp 18    Ht 5\' 8"  (1.727 m)    Wt 134.3 kg    LMP 07/30/2021    SpO2 100%    BMI 45.01 kg/m  Physical Exam Vitals and nursing note reviewed.  Constitutional:      General: She is not in acute distress.     Appearance: Normal appearance.  HENT:     Head: Normocephalic and atraumatic.     Mouth/Throat:     Comments: Some posterior pharynx erythema, otherwise normal-appearing, plus tonsils bilaterally.  No uvular deviation.  No peritonsillar abscess. Eyes:     General:        Right eye: No discharge.        Left eye: No discharge.  Neck:     Comments: Some tenderness to palpation without significant swelling on the left side of the neck, there are some palpable thready lymph nodes.  Patient has full range of motion neck, no rigidity. Cardiovascular:     Rate and Rhythm: Normal rate and regular rhythm.     Heart sounds: No murmur heard.   No friction rub. No gallop.  Pulmonary:     Effort: Pulmonary effort is normal.     Breath sounds: Normal breath sounds.  Abdominal:     General: Bowel sounds are normal.     Palpations: Abdomen is soft.  Genitourinary:    Comments: External vulva within normal limits.  There is a fair amount of thick white discharge in the vaginal vault.  Cervix appears normal.  No cervical motion tenderness.  No adnexal fullness or tenderness.  Minimal suprapubic discomfort with palpation. Musculoskeletal:  Cervical back: Normal range of motion. No rigidity.  Skin:    General: Skin is warm and dry.     Capillary Refill: Capillary refill takes less than 2 seconds.  Neurological:     Mental Status: She is alert and oriented to person, place, and time.  Psychiatric:        Mood and Affect: Mood normal.        Behavior: Behavior normal.    ED Results / Procedures / Treatments   Labs (all labs ordered are listed, but only abnormal results are displayed) Labs Reviewed  RESP PANEL BY RT-PCR (FLU A&B, COVID) [id number]    EKG None  Radiology No results found.  Procedures Procedures    Medications Ordered in ED Medications - No data to display  ED Course/ Medical Decision Making/ A&P                           Medical Decision Making  Is an overall  well-appearing patient with 2 issues that she complaining of.  First her left-sided throat pain, sinus pressure.  On physical exam she has no evidence of mass in her left neck, she does have some irritation of the posterior oropharynx.  Her uvula is midline, her tonsils are 1+ bilaterally.  She has some congestion noted, however she is afebrile, no signs of bacterial sinusitis.  Discussed differential includes lingering upper respiratory infection, viral pharyngitis, viral sinusitis.  Encouraged continued use of over-the-counter allergy medication and follow-up with PCP.  Second patient reports some vaginal itching, vaginal discharge.  My physical exam was significant for fair amount of white thick discharge consistent with yeast infection.  There was no cervical motion tenderness, no adnexal fullness, minimal suprapubic tenderness.  No vaginal bleeding, lesions on cervix.  I personally ordered and reviewed lab work including urinalysis, wet prep, history of virus panel.  Respiratory virus panel was negative for flu, COVID.  Wet prep was negative for trichomonas, BV, yeast.  Her urinalysis is unremarkable, there is a rare bacteria, leukocytes, however I do not believe this represents an acute bacterial infection of the bladder.  Discussed with patient clinically I do believe that she has a yeast infection despite negative wet prep.  Minimal clinical concern for STI at this time, discussed that GC, chlamydia are pending.  After shared decision-making with patient we will treat presumptively for yeast infection, and encouraged her to follow-up with her results for GC, chlamydia and return for treatment if either of these are positive.  Discussed no sexual activity until all results are back.  Patient understands and agrees to plan, discharged in stable condition at this time. Final Clinical Impression(s) / ED Diagnoses Final diagnoses:  None    Rx / DC Orders ED Discharge Orders     None          Anselmo Pickler, PA-C 08/09/21 1355    Lennice Sites, DO 08/09/21 1537

## 2021-08-09 NOTE — Discharge Instructions (Addendum)
As we discussed your wet prep was negative for yeast, or any other infections, however your physical exam was consistent with a yeast infection on my exam.  Your gonorrhea, chlamydia probes are pending.  If they do come back positive please return either here or, health, community wellness for treatment for sexually transmitted infection.  Until your results come back I would recommend staying away from sexual activity.  In addition to the oral antifungal that I am prescribing, it may be helpful to do an over-the-counter Monistat treatment.  If you have any lingering symptoms despite treatment please return for further evaluation to your primary care, gynecologist or to the emergency department.

## 2021-08-09 NOTE — ED Triage Notes (Signed)
Pt c/o flu like sx x 6 days-vaginal d/c, irritation x 2 weeks-NAD-steady gait

## 2021-08-10 LAB — GC/CHLAMYDIA PROBE AMP (~~LOC~~) NOT AT ARMC
Chlamydia: NEGATIVE
Comment: NEGATIVE
Comment: NORMAL
Neisseria Gonorrhea: NEGATIVE

## 2021-10-29 ENCOUNTER — Other Ambulatory Visit: Payer: Self-pay

## 2021-10-29 ENCOUNTER — Emergency Department (HOSPITAL_BASED_OUTPATIENT_CLINIC_OR_DEPARTMENT_OTHER)
Admission: EM | Admit: 2021-10-29 | Discharge: 2021-10-29 | Disposition: A | Discharge status: Home / Self Care | Payer: Self-pay | Attending: Emergency Medicine | Admitting: Emergency Medicine

## 2021-10-29 ENCOUNTER — Encounter (HOSPITAL_BASED_OUTPATIENT_CLINIC_OR_DEPARTMENT_OTHER): Payer: Self-pay | Admitting: Emergency Medicine

## 2021-10-29 DIAGNOSIS — J029 Acute pharyngitis, unspecified: Secondary | ICD-10-CM | POA: Insufficient documentation

## 2021-10-29 DIAGNOSIS — Z79899 Other long term (current) drug therapy: Secondary | ICD-10-CM | POA: Insufficient documentation

## 2021-10-29 LAB — GROUP A STREP BY PCR: Group A Strep by PCR: NOT DETECTED

## 2021-10-29 MED ORDER — CLINDAMYCIN HCL 150 MG PO CAPS: 450.0000 mg | ORAL_CAPSULE | Freq: Three times a day (TID) | ORAL | 0 refills | Status: DC

## 2021-10-29 MED ORDER — DEXAMETHASONE 10 MG/ML FOR PEDIATRIC ORAL USE
10.0000 mg | Freq: Once | INTRAMUSCULAR | Status: AC
  Administered 2021-10-29: 10 mg via ORAL
  Filled 2021-10-29: qty 1

## 2021-10-29 MED ORDER — PENICILLIN G BENZATHINE 1200000 UNIT/2ML IM SUSY
1.2000 10*6.[IU] | PREFILLED_SYRINGE | Freq: Once | INTRAMUSCULAR | Status: AC
  Administered 2021-10-29: 1.2 10*6.[IU] via INTRAMUSCULAR
  Filled 2021-10-29: qty 2

## 2021-10-29 NOTE — Discharge Instructions (Addendum)
Your strep test was negative. I treated you with prophylaxis antibiotics though for suspected false negative test. You have already received these antibiotics in the ED. Please take these as prescribed. If your symptoms continue to worsen, please return to the ED.  ?

## 2021-10-29 NOTE — ED Provider Notes (Signed)
?[facility] EMERGENCY DEPARTMENT ?Provider Note ? ? ?CSN: [id number] ?Arrival date & time: 10/29/21  6606 ? ?  ? ?History ?PMH: n/a ?Chief Complaint  ?Patient presents with  ? Sore Throat  ? ? ?Andrea Glover is a 31 y.o. female. ?Presents the ED with chief complaint of sore throat.  She says that over the last weekend, she started to feel generally fatigued and had a scratchy throat.  She is concerned that this was her allergies so she started taking her Claritin again.  She is on Monday she started to have and sore throat.  This progressively worsened throughout the week.  Over the past 3 days she sounded much more hoarse than normal and has had a decreased appetite.  She says she is also had increased difficulty swallowing.  She is tolerating p.o., but seems to have to try a lot harder to get the food down.  She denies any fevers, but she did have some chills on Monday.  He has a history of frequent strep throat infections.  She says she looked in the back of her throat started noticing white spots on her tonsils. She denies any cough, congestion, rhinorrhea, ear pain, chest pain, shortness of breath, fever, nausea, or vomiting.  ? ? ?Sore Throat ?Pertinent negatives include no chest pain, no abdominal pain and no shortness of breath.  ? ?  ? ?Home Medications ?Prior to Admission medications   ?Medication Sig Start Date End Date Taking? Authorizing Provider  ?diclofenac (VOLTAREN) 75 MG EC tablet Take 1 tablet (75 mg total) by mouth 2 (two) times daily. 04/23/21   Elson Areas, PA-C  ?fluticasone (FLONASE) 50 MCG/ACT nasal spray Place 2 sprays into both nostrils daily. 05/03/17   Loren Racer, MD  ?loratadine (CLARITIN) 10 MG tablet Take 1 tablet (10 mg total) by mouth daily. 05/03/17   Loren Racer, MD  ?loratadine (CLARITIN) 10 MG tablet Take 1 tablet (10 mg total) by mouth daily. 05/21/17   Kellie Shropshire, PA-C  ?   ? ?Allergies    ?Patient has no known allergies.   ? ?Review of Systems    ?Review of Systems  ?Constitutional:  Positive for appetite change, chills and fatigue. Negative for fever.  ?HENT:  Positive for sore throat and voice change. Negative for congestion, dental problem, ear pain and rhinorrhea.   ?Eyes:  Negative for discharge, redness and itching.  ?Respiratory:  Negative for cough and shortness of breath.   ?Cardiovascular:  Negative for chest pain.  ?Gastrointestinal:  Negative for abdominal pain, nausea and vomiting.  ?Skin:  Negative for rash.  ?All other systems reviewed and are negative. ? ?Physical Exam ?Updated Vital Signs ?BP 139/66 (BP Location: Right Arm)   Pulse 67   Temp 97.7 ?F (36.5 ?C) (Oral)   Resp 18   SpO2 100%  ?Physical Exam ?Vitals and nursing note reviewed.  ?Constitutional:   ?   General: She is not in acute distress. ?   Appearance: Normal appearance. She is well-developed. She is obese. She is not ill-appearing, toxic-appearing or diaphoretic.  ?HENT:  ?   Head: Normocephalic and atraumatic.  ?   Right Ear: Tympanic membrane and ear canal normal. No drainage, swelling or tenderness. No middle ear effusion. Tympanic membrane is not erythematous.  ?   Left Ear: Tympanic membrane and ear canal normal. No drainage, swelling or tenderness.  No middle ear effusion. Tympanic membrane is not erythematous.  ?   Nose: No nasal deformity, congestion or  rhinorrhea.  ?   Mouth/Throat:  ?   Lips: Pink. No lesions.  ?   Mouth: Mucous membranes are moist. No oral lesions.  ?   Pharynx: Oropharynx is clear. Uvula midline. Posterior oropharyngeal erythema present. No pharyngeal swelling, oropharyngeal exudate or uvula swelling.  ?   Tonsils: Tonsillar exudate present. No tonsillar abscesses. 2+ on the right. 2+ on the left.  ?   Comments: Bilateral tonsillar exudate. Uvula midline. No abscess noted. Tongue not raised. ?Eyes:  ?   General: Gaze aligned appropriately. No scleral icterus.    ?   Right eye: No discharge.     ?   Left eye: No discharge.  ?    Conjunctiva/sclera: Conjunctivae normal.  ?   Right eye: Right conjunctiva is not injected. No exudate or hemorrhage. ?   Left eye: Left conjunctiva is not injected. No exudate or hemorrhage. ?Neck:  ?   Comments: Bilateral anterior lymphadenopathy.  ?Pulmonary:  ?   Effort: Pulmonary effort is normal. No respiratory distress.  ?Musculoskeletal:  ?   Cervical back: Neck supple.  ?Lymphadenopathy:  ?   Cervical: Cervical adenopathy present.  ?Skin: ?   General: Skin is warm and dry.  ?Neurological:  ?   Mental Status: She is alert and oriented to person, place, and time.  ?Psychiatric:     ?   Mood and Affect: Mood normal.     ?   Speech: Speech normal.     ?   Behavior: Behavior normal. Behavior is cooperative.  ? ? ?ED Results / Procedures / Treatments   ?Labs ?(all labs ordered are listed, but only abnormal results are displayed) ?Labs Reviewed  ?GROUP A STREP BY PCR  ? ? ?EKG ?None ? ?Radiology ?No results found. ? ?Procedures ?Procedures  ? ? ?Medications Ordered in ED ?Medications  ?dexamethasone (DECADRON) 10 MG/ML injection for Pediatric ORAL use 10 mg (has no administration in time range)  ?penicillin g benzathine (BICILLIN LA) 1200000 UNIT/2ML injection 1.2 Million Units (has no administration in time range)  ? ? ?ED Course/ Medical Decision Making/ A&P ?  ?                        ?Medical Decision Making ?Risk ?Prescription drug management. ? ? ? ?MDM  ?This is a 31 y.o. female who presents to the ED with sore throat ?The differential of this patient includes but is not limited to Ludwig's Angina, Mononucleosis, Peritonsillar Abscess, Retropharyngeal Abscess, Strep throat, and Viral Pharyngitis ? ? ?My Impression, Plan, and ED Course: Patient is well-appearing with normal vitals.  HEENT exam only notable for bilateral tonsillar exudates with 2+ swelling bilaterally.  No sign of PTA.  Neck with symmetrical anterior lymphadenopathy.  No obvious neck swelling.  Patient able to swallow without difficulty.   Sounds like she has had decreased appetite but is still tolerating p.o. intake which is reassuring.  She has not had any fevers at home.  Doubt PTA, RPA, foreign body, epiglottis, or Ludwig's. Will order strep test.  I do not think that we require any further imaging for deep space infection. ? ?I personally ordered, reviewed, and interpreted all laboratory work and imaging and agree with radiologist interpretation. Results interpreted below: strep negative ? ? ?Despite negative strep test, patient's presentation is highly consistent with strep pharyngitis.  We will go ahead and treat prophylactically for bacterial infection.  Decadron dose x1 also given in the ED. ? ?Charting Requirements ?Additional history  is obtained from:  Independent historian ?External Records from outside source obtained and reviewed including: Previous ED visits for pharyngitis ?Social Determinants of Health:  Access to medical care ?Pertinant PMH that complicates patient's illness: n/a ? ?Patient Care ?Problems that were addressed during this visit: ?- Sore Throat: Acute illness with complication ?Medications given in ED: Decadron, Bicillin L-A ?Reevaluation of the patient after these medicines showed that the patient stayed the same ?Disposition: abx, return precautions ? ?This is a supervised visit with my attending physician, Dr. Anitra Lauth. We have discussed this patient and they have altered the plan as needed. ? ?Portions of this note were generated with Scientist, clinical (histocompatibility and immunogenetics). Dictation errors may occur despite best attempts at proofreading. ?  ? ?Final Clinical Impression(s) / ED Diagnoses ?Final diagnoses:  ?Pharyngitis, unspecified etiology  ? ? ?Rx / DC Orders ?ED Discharge Orders   ? ?      Ordered  ?  clindamycin (CLEOCIN) 150 MG capsule  3 times daily,   Status:  Discontinued       ? 10/29/21 1030  ? ?  ?  ? ?  ? ? ?  ?Claudie Leach, PA-C ?10/29/21 1041 ? ?  ?Gwyneth Sprout, MD ?10/29/21 1440 ? ?

## 2021-10-29 NOTE — ED Triage Notes (Signed)
Pt reports sore throat and trouble swallowing that started Monday. Denies fevers.  ?

## 2023-01-29 ENCOUNTER — Encounter (HOSPITAL_BASED_OUTPATIENT_CLINIC_OR_DEPARTMENT_OTHER): Payer: Self-pay

## 2023-01-29 ENCOUNTER — Emergency Department (HOSPITAL_BASED_OUTPATIENT_CLINIC_OR_DEPARTMENT_OTHER): Payer: BLUE CROSS/BLUE SHIELD

## 2023-01-29 ENCOUNTER — Emergency Department (HOSPITAL_BASED_OUTPATIENT_CLINIC_OR_DEPARTMENT_OTHER)
Admission: EM | Admit: 2023-01-29 | Discharge: 2023-01-29 | Disposition: A | Discharge status: Home / Self Care | Payer: BLUE CROSS/BLUE SHIELD | Attending: Emergency Medicine | Admitting: Emergency Medicine

## 2023-01-29 ENCOUNTER — Other Ambulatory Visit: Payer: Self-pay

## 2023-01-29 DIAGNOSIS — R112 Nausea with vomiting, unspecified: Secondary | ICD-10-CM | POA: Insufficient documentation

## 2023-01-29 DIAGNOSIS — R197 Diarrhea, unspecified: Secondary | ICD-10-CM | POA: Insufficient documentation

## 2023-01-29 DIAGNOSIS — R61 Generalized hyperhidrosis: Secondary | ICD-10-CM | POA: Diagnosis not present

## 2023-01-29 DIAGNOSIS — R1011 Right upper quadrant pain: Secondary | ICD-10-CM | POA: Diagnosis not present

## 2023-01-29 LAB — CBC
HCT: 43.9 % (ref 36.0–46.0)
Hemoglobin: 14.2 g/dL (ref 12.0–15.0)
MCH: 25.7 pg — ABNORMAL LOW (ref 26.0–34.0)
MCHC: 32.3 g/dL (ref 30.0–36.0)
MCV: 79.5 fL — ABNORMAL LOW (ref 80.0–100.0)
Platelets: 253 10*3/uL (ref 150–400)
RBC: 5.52 MIL/uL — ABNORMAL HIGH (ref 3.87–5.11)
RDW: 17.5 % — ABNORMAL HIGH (ref 11.5–15.5)
WBC: 8.9 10*3/uL (ref 4.0–10.5)
nRBC: 0 % (ref 0.0–0.2)

## 2023-01-29 LAB — URINALYSIS, ROUTINE W REFLEX MICROSCOPIC
Bilirubin Urine: NEGATIVE
Glucose, UA: NEGATIVE mg/dL
Hgb urine dipstick: NEGATIVE
Ketones, ur: NEGATIVE mg/dL
Leukocytes,Ua: NEGATIVE
Nitrite: NEGATIVE
Protein, ur: 30 mg/dL — AB
Specific Gravity, Urine: 1.02 (ref 1.005–1.030)
pH: 8.5 — ABNORMAL HIGH (ref 5.0–8.0)

## 2023-01-29 LAB — COMPREHENSIVE METABOLIC PANEL
ALT: 15 U/L (ref 0–44)
AST: 23 U/L (ref 15–41)
Albumin: 3.6 g/dL (ref 3.5–5.0)
Alkaline Phosphatase: 73 U/L (ref 38–126)
Anion gap: 12 (ref 5–15)
BUN: 12 mg/dL (ref 6–20)
CO2: 20 mmol/L — ABNORMAL LOW (ref 22–32)
Calcium: 9.1 mg/dL (ref 8.9–10.3)
Chloride: 104 mmol/L (ref 98–111)
Creatinine, Ser: 0.7 mg/dL (ref 0.44–1.00)
GFR, Estimated: >60 mL/min (ref >60)
Glucose, Bld: 109 mg/dL — ABNORMAL HIGH (ref 70–99)
Potassium: 3.6 mmol/L (ref 3.5–5.1)
Sodium: 136 mmol/L (ref 135–145)
Total Bilirubin: 0.4 mg/dL (ref 0.3–1.2)
Total Protein: 8.6 g/dL — ABNORMAL HIGH (ref 6.5–8.1)

## 2023-01-29 LAB — URINALYSIS, MICROSCOPIC (REFLEX)

## 2023-01-29 LAB — LIPASE, BLOOD: Lipase: 27 U/L (ref 11–51)

## 2023-01-29 LAB — PREGNANCY, URINE: Preg Test, Ur: NEGATIVE

## 2023-01-29 MED ORDER — ONDANSETRON 4 MG PO TBDP
4.0000 mg | ORAL_TABLET | Freq: Once | ORAL | Status: AC
  Administered 2023-01-29: 4 mg via ORAL
  Filled 2023-01-29: qty 1

## 2023-01-29 MED ORDER — SODIUM CHLORIDE 0.9 % IV BOLUS
1000.0000 mL | Freq: Once | INTRAVENOUS | Status: AC
  Administered 2023-01-29: 1000 mL via INTRAVENOUS

## 2023-01-29 MED ORDER — KETOROLAC TROMETHAMINE 15 MG/ML IJ SOLN
15.0000 mg | Freq: Once | INTRAMUSCULAR | Status: AC
  Administered 2023-01-29: 15 mg via INTRAVENOUS
  Filled 2023-01-29 (×2): qty 1

## 2023-01-29 MED ORDER — ONDANSETRON HCL 4 MG PO TABS: 4.0000 mg | ORAL_TABLET | Freq: Four times a day (QID) | ORAL | 0 refills | Status: AC

## 2023-01-29 MED ORDER — ONDANSETRON HCL 4 MG/2ML IJ SOLN
4.0000 mg | Freq: Once | INTRAMUSCULAR | Status: DC
  Filled 2023-01-29: qty 2

## 2023-01-29 NOTE — ED Notes (Signed)
Discharge instructions discussed with pt. Pt verbalized understanding. Pt stable and ambulatory.  °

## 2023-01-29 NOTE — ED Triage Notes (Signed)
Patient having ABD pain and vomiting since 6 am.

## 2023-01-29 NOTE — ED Notes (Signed)
Pt says she accidentally removed IV while throwing up.

## 2023-01-29 NOTE — ED Provider Notes (Signed)
[place] [facility] Provider Note   CSN: [id number] Arrival date & time: 01/29/23  0909     History  Chief Complaint  Patient presents with   Abdominal Pain   Emesis    Andrea Glover is a 32 y.o. female.  Patient is a 32 year old female presenting for abdominal pain.  Patient admits to generalized abdominal pain worse in the right upper quadrant, nausea, vomiting, diarrhea, and sweating.  Symptoms started this morning.  Admits to 1 prior episode like this in the past with diagnosis of possible food poisoning.  Patient denies any suspicious food intake at this time.  No one else around her is sick at this time.  She denies a previous diagnosis of cannabis induced hyperemesis syndrome however does admit to daily marijuana use.  Denies any hematochezia or melena.  Denies abdominal surgical history.  The history is provided by the patient. No language interpreter was used.  Abdominal Pain Associated symptoms: diarrhea, nausea and vomiting   Associated symptoms: no chest pain, no chills, no cough, no dysuria, no fever, no hematuria, no shortness of breath and no sore throat   Emesis Associated symptoms: abdominal pain and diarrhea   Associated symptoms: no arthralgias, no chills, no cough, no fever and no sore throat        Home Medications Prior to Admission medications   Medication Sig Start Date End Date Taking? Authorizing Provider  ondansetron (ZOFRAN) 4 MG tablet Take 1 tablet (4 mg total) by mouth every 6 (six) hours. 01/29/23  Yes Edwin Dada P, DO  diclofenac (VOLTAREN) 75 MG EC tablet Take 1 tablet (75 mg total) by mouth 2 (two) times daily. 04/23/21   Elson Areas, PA-C  fluticasone (FLONASE) 50 MCG/ACT nasal spray Place 2 sprays into both nostrils daily. 05/03/17   Loren Racer, MD  loratadine (CLARITIN) 10 MG tablet Take 1 tablet (10 mg total) by mouth daily. 05/03/17   Loren Racer, MD  loratadine (CLARITIN) 10 MG tablet  Take 1 tablet (10 mg total) by mouth daily. 05/21/17   Kellie Shropshire, PA-C      Allergies    Patient has no known allergies.    Review of Systems   Review of Systems  Constitutional:  Negative for chills and fever.  HENT:  Negative for ear pain and sore throat.   Eyes:  Negative for pain and visual disturbance.  Respiratory:  Negative for cough and shortness of breath.   Cardiovascular:  Negative for chest pain and palpitations.  Gastrointestinal:  Positive for abdominal pain, diarrhea, nausea and vomiting.  Genitourinary:  Negative for dysuria and hematuria.  Musculoskeletal:  Negative for arthralgias and back pain.  Skin:  Negative for color change and rash.  Neurological:  Negative for seizures and syncope.  All other systems reviewed and are negative.   Physical Exam Updated Vital Signs BP 117/83   Pulse (!) 54   Temp 97.7 F (36.5 C) (Oral)   Resp 18   Ht 5\' 8"  (1.727 m)   Wt 134 kg   SpO2 100%   BMI 44.92 kg/m  Physical Exam Vitals and nursing note reviewed.  Constitutional:      General: She is not in acute distress.    Appearance: She is well-developed.  HENT:     Head: Normocephalic and atraumatic.  Eyes:     Conjunctiva/sclera: Conjunctivae normal.  Cardiovascular:     Rate and Rhythm: Normal rate and regular rhythm.  Heart sounds: No murmur heard. Pulmonary:     Effort: Pulmonary effort is normal. No respiratory distress.     Breath sounds: Normal breath sounds.  Abdominal:     Palpations: Abdomen is soft.     Tenderness: There is abdominal tenderness in the right upper quadrant. There is guarding. There is no rebound.  Musculoskeletal:        General: No swelling.     Cervical back: Neck supple.  Skin:    General: Skin is warm and dry.     Capillary Refill: Capillary refill takes less than 2 seconds.  Neurological:     Mental Status: She is alert.  Psychiatric:        Mood and Affect: Mood normal.     ED Results / Procedures /  Treatments   Labs (all labs ordered are listed, but only abnormal results are displayed) Labs Reviewed  COMPREHENSIVE METABOLIC PANEL - Abnormal; Notable for the following components:      Result Value   CO2 20 (*)    Glucose, Bld 109 (*)    Total Protein 8.6 (*)    All other components within normal limits  CBC - Abnormal; Notable for the following components:   RBC 5.52 (*)    MCV 79.5 (*)    MCH 25.7 (*)    RDW 17.5 (*)    All other components within normal limits  URINALYSIS, ROUTINE W REFLEX MICROSCOPIC - Abnormal; Notable for the following components:   APPearance CLOUDY (*)    pH 8.5 (*)    Protein, ur 30 (*)    All other components within normal limits  URINALYSIS, MICROSCOPIC (REFLEX) - Abnormal; Notable for the following components:   Bacteria, UA RARE (*)    All other components within normal limits  LIPASE, BLOOD  PREGNANCY, URINE    EKG None  Radiology No results found.  Procedures Procedures    Medications Ordered in ED Medications  ondansetron (ZOFRAN-ODT) disintegrating tablet 4 mg (4 mg Oral Given 01/29/23 0939)  sodium chloride 0.9 % bolus 1,000 mL (0 mLs Intravenous Stopped 01/29/23 1215)  ketorolac (TORADOL) 15 MG/ML injection 15 mg (15 mg Intravenous Given 01/29/23 1039)    ED Course/ Medical Decision Making/ A&P                             Medical Decision Making Amount and/or Complexity of Data Reviewed Labs: ordered. Radiology: ordered.  Risk Prescription drug management.   51:24 AM 32 year old female presenting for abdominal pain.  Patient is alert oriented x 3, no acute distress, afebrile, still vital signs.  Abdomen is tender in the right upper quadrant with guarding.  No rebounding or distention.  IV fluids and Toradol given.  Zofran given for symptomatic management.  Laboratory studies demonstrate no leukocytosis.  No signs or symptoms of sepsis.  Stable electrolytes.  Right upper quadrant ultrasound demonstrates no acute process.   Common bile duct 3.5 mm.    On reevaluation of patient she has significant improvement of pain.  Talked about her chronic cannabis use and the possibility of cannabis hyperemesis syndrome.  Educational handout given.  Recommended to withhold from her Nickola Major use at this time until symptoms improve with consideration of sensation of marijuana use.  Patient in no distress and overall condition improved here in the ED. Detailed discussions were had with the patient regarding current findings, and need for close f/u with PCP or on call doctor.  The patient has been instructed to return immediately if the symptoms worsen in any way for re-evaluation. Patient verbalized understanding and is in agreement with current care plan. All questions answered prior to discharge.         Final Clinical Impression(s) / ED Diagnoses Final diagnoses:  Nausea and vomiting, unspecified vomiting type    Rx / DC Orders ED Discharge Orders          Ordered    ondansetron (ZOFRAN) 4 MG tablet  Every 6 hours        01/29/23 1232              Franne Forts, DO 02/04/23 [phone]

## 2024-03-27 ENCOUNTER — Encounter (HOSPITAL_BASED_OUTPATIENT_CLINIC_OR_DEPARTMENT_OTHER): Payer: Self-pay

## 2024-03-27 ENCOUNTER — Emergency Department (HOSPITAL_BASED_OUTPATIENT_CLINIC_OR_DEPARTMENT_OTHER)
Admission: EM | Admit: 2024-03-27 | Discharge: 2024-03-27 | Discharge status: Against Medical Advice | Payer: Self-pay | Attending: Emergency Medicine | Admitting: Emergency Medicine

## 2024-03-27 ENCOUNTER — Other Ambulatory Visit: Payer: Self-pay

## 2024-03-27 DIAGNOSIS — Z5321 Procedure and treatment not carried out due to patient leaving prior to being seen by health care provider: Secondary | ICD-10-CM | POA: Insufficient documentation

## 2024-03-27 DIAGNOSIS — M25552 Pain in left hip: Secondary | ICD-10-CM | POA: Insufficient documentation

## 2024-03-27 LAB — PREGNANCY, URINE: Preg Test, Ur: NEGATIVE

## 2024-03-27 LAB — URINALYSIS, ROUTINE W REFLEX MICROSCOPIC
Bilirubin Urine: NEGATIVE
Glucose, UA: NEGATIVE mg/dL
Hgb urine dipstick: NEGATIVE
Ketones, ur: NEGATIVE mg/dL
Leukocytes,Ua: NEGATIVE
Nitrite: NEGATIVE
Protein, ur: NEGATIVE mg/dL
Specific Gravity, Urine: 1.025 (ref 1.005–1.030)
pH: 5.5 (ref 5.0–8.0)

## 2024-03-27 NOTE — ED Triage Notes (Signed)
 Pt describes sciatica type pain for over a week and worse today.   Shooting pain from left side of back to hip down leg +spasms

## 2024-03-27 NOTE — ED Notes (Signed)
Cup given to pt for urine specimen. 

## 2024-04-15 ENCOUNTER — Encounter (HOSPITAL_BASED_OUTPATIENT_CLINIC_OR_DEPARTMENT_OTHER): Payer: Self-pay | Admitting: Emergency Medicine

## 2024-04-15 ENCOUNTER — Other Ambulatory Visit: Payer: Self-pay

## 2024-04-15 ENCOUNTER — Emergency Department (HOSPITAL_BASED_OUTPATIENT_CLINIC_OR_DEPARTMENT_OTHER)
Admission: EM | Admit: 2024-04-15 | Discharge: 2024-04-15 | Disposition: A | Discharge status: Home / Self Care | Payer: Self-pay | Attending: Emergency Medicine | Admitting: Emergency Medicine

## 2024-04-15 DIAGNOSIS — M5410 Radiculopathy, site unspecified: Secondary | ICD-10-CM | POA: Insufficient documentation

## 2024-04-15 MED ORDER — DICLOFENAC SODIUM 1 % EX GEL: 2.0000 g | Freq: Four times a day (QID) | CUTANEOUS | 0 refills | Status: AC

## 2024-04-15 MED ORDER — NAPROXEN 500 MG PO TABS: 500.0000 mg | ORAL_TABLET | Freq: Two times a day (BID) | ORAL | 0 refills | Status: AC

## 2024-04-15 MED ORDER — DEXAMETHASONE SODIUM PHOSPHATE 10 MG/ML IJ SOLN
10.0000 mg | Freq: Once | INTRAMUSCULAR | Status: AC
  Administered 2024-04-15: 10 mg via INTRAMUSCULAR
  Filled 2024-04-15: qty 1

## 2024-04-15 NOTE — ED Triage Notes (Signed)
 Recurrent left leg , pain at buttock radiating down to lower leg , numbness to left toes x 3 days . Was seen on 9/3 yet left after triage .

## 2024-04-15 NOTE — ED Provider Notes (Signed)
  [facility] Provider Note   CSN: [id number] Arrival date & time: 04/15/24  9065     Patient presents with: Leg Pain (left)   Andrea Glover is a 33 y.o. female.  33 year old female presents to ED with complaints of 1 month of left leg pain with associated swelling and numbness.  Patient reports that the pain feels like pins-and-needles shooting down her left leg and she has associated numbness on her three most lateral toes.  Patient reports long bouts of sitting makes it worse.  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  She has tried Voltaren  gel with some relief and ibuprofen  has not been beneficial at all.  Patient denies any falls or traumatic incidents.     Prior to Admission medications   Medication Sig Start Date End Date Taking? Authorizing Provider  diclofenac  Sodium (VOLTAREN ) 1 % GEL Apply 2 g topically 4 (four) times daily. 04/15/24  Yes Myriam Fonda RAMAN, PA-C  naproxen  (NAPROSYN ) 500 MG tablet Take 1 tablet (500 mg total) by mouth 2 (two) times daily. 04/15/24  Yes Myriam Fonda RAMAN, PA-C  diclofenac  (VOLTAREN ) 75 MG EC tablet Take 1 tablet (75 mg total) by mouth 2 (two) times daily. 04/23/21   Sofia, Leslie K, PA-C  fluticasone  (FLONASE ) 50 MCG/ACT nasal spray Place 2 sprays into both nostrils daily. 05/03/17   Carlyle Lenis, MD  loratadine  (CLARITIN ) 10 MG tablet Take 1 tablet (10 mg total) by mouth daily. 05/03/17   Carlyle Lenis, MD  loratadine  (CLARITIN ) 10 MG tablet Take 1 tablet (10 mg total) by mouth daily. 05/21/17   Delwyn Damien PARAS, PA-C  ondansetron  (ZOFRAN ) 4 MG tablet Take 1 tablet (4 mg total) by mouth every 6 (six) hours. 01/29/23   Elnor Bernarda SQUIBB, DO    Allergies: Patient has no known allergies.    Review of Systems  Musculoskeletal:  Positive for arthralgias and myalgias.  Neurological:  Positive for numbness.  All other systems reviewed and are negative.   Updated  Vital Signs BP (!) 153/75 (BP Location: Left Wrist)   Pulse 83   Temp 98.2 F (36.8 C) (Oral)   Resp 17   Wt 131.7 kg   LMP 03/22/2024 (Exact Date)   SpO2 100%   BMI 44.14 kg/m   Physical Exam Vitals and nursing note reviewed.  Constitutional:      Appearance: Normal appearance.  HENT:     Head: Normocephalic and atraumatic.     Nose: Nose normal.  Eyes:     Extraocular Movements: Extraocular movements intact.     Conjunctiva/sclera: Conjunctivae normal.     Pupils: Pupils are equal, round, and reactive to light.  Cardiovascular:     Rate and Rhythm: Normal rate.  Pulmonary:     Effort: Pulmonary effort is normal. No respiratory distress.  Musculoskeletal:        General: No swelling, tenderness, deformity or signs of injury. Normal range of motion.     Cervical back: Normal range of motion.     Right lower leg: Normal. No edema.     Left lower leg: No edema.     Left foot: No swelling or deformity.  Skin:    General: Skin is warm.     Capillary Refill: Capillary refill takes less than 2 seconds.  Neurological:     General: No focal deficit present.     Mental Status: She is alert.  Psychiatric:  Mood and Affect: Mood normal.        Behavior: Behavior normal.     (all labs ordered are listed, but only abnormal results are displayed) Labs Reviewed - No data to display  EKG: None  Radiology: No results found.  33 y.o. female presents to the ED with complaints of left leg pain and foot numbness x 1 month, this involves an extensive number of treatment options, and is a complaint that carries with it a high risk of complications and morbidity.  The differential diagnosis includes sciatica, degenerative disc disease, radiculopathy, radicular pain, compression fracture, CVA, shingles (Ddx)  On arrival pt is nontoxic, vitals unremarkable. Exam significant for positive straight leg raise and decree sensation bilateral foot.  Through chart review patient has  significant history of GERD treated with Protonix by PCP.  I ordered medication Decadron  for nerve pain   ED Course:   33 year old female presents to ED with complaints of left leg pain and left foot numbness that has been going on for approximately 1 month now.  Patient has positive straight leg raise in the left leg.  And some radicular pain on palpation of lower back.  Patient describes the pain as pins-and-needles.  Patient has not had any loss of function and no focal deficits on exam.  Patient reports she still has feeling in the area just dulled sensation.  She has tried Voltaren  gel with mild relief.  Ibuprofen  has not helped at all.  There is no obvious swelling on the ankle.  No pain to palpation throughout the leg.  And passive range of motion is intact fully.  No reported injuries/traumas and no obvious deformities or injuries on exam.  Patient has relief with standing and walking around.  Exacerbations with long bouts of sitting.  No bony tenderness on the lumbar spine.  With length of symptoms and no other deficits or weaknesses CVA or TIA low suspicion.  Patient has no unilateral leg swelling and denies birth control.  Patient advises that her only recent trip was several weeks ago and they broke it up intermittently in hour bouts of driving.  After discussion with attending it was advised to do a steroid injection and outpatient course of naproxen .  Patient was advised to monitor for worsening symptoms and if symptoms do not resolve in approximately 1 month call neurosurgery for further recommendations.  Patient was advised to return to ED for worsening symptoms.   Portions of this note were generated with Scientist, clinical (histocompatibility and immunogenetics). Dictation errors may occur despite best attempts at proofreading.    Final diagnoses:  Radicular pain of left lower extremity    ED Discharge Orders          Ordered    naproxen  (NAPROSYN ) 500 MG tablet  2 times daily        04/15/24 1021     diclofenac  Sodium (VOLTAREN ) 1 % GEL  4 times daily        04/15/24 [address] [place] 04/15/24 1051    Darra Fonda MATSU, MD 04/16/24 [phone]

## 2024-04-15 NOTE — Discharge Instructions (Addendum)
 You have been given a steroid shot today to try to help any inflammation or swelling around the nerves.  Take the naproxen  max 2 times daily as needed for pain.  It is advised to monitor for worsening symptoms.  A neurology referral has been attached if symptoms worsen or do not get better within the month call to make an appointment.  If new symptoms arise including severe weakness or loss of feeling in the leg return to ED for further evaluation.
